# Patient Record
Sex: Male | Born: 1975 | Race: White | Hispanic: No | Smoking: Current every day smoker
Health system: Southern US, Community
[De-identification: ages and names within clinical notes are randomized; demographics above are authoritative.]

## PROBLEM LIST (undated history)

## (undated) DIAGNOSIS — K219 Gastro-esophageal reflux disease without esophagitis: Secondary | ICD-10-CM

## (undated) DIAGNOSIS — Z8669 Personal history of other diseases of the nervous system and sense organs: Secondary | ICD-10-CM

## (undated) DIAGNOSIS — Z8679 Personal history of other diseases of the circulatory system: Secondary | ICD-10-CM

## (undated) DIAGNOSIS — F419 Anxiety disorder, unspecified: Secondary | ICD-10-CM

## (undated) HISTORY — DX: Gastro-esophageal reflux disease without esophagitis: K21.9

## (undated) HISTORY — DX: Personal history of other diseases of the circulatory system: Z86.79

## (undated) HISTORY — PX: BACK SURGERY: SHX140

## (undated) HISTORY — DX: Personal history of other diseases of the nervous system and sense organs: Z86.69

---

## 2011-08-11 ENCOUNTER — Encounter (HOSPITAL_COMMUNITY): Payer: Self-pay

## 2011-08-11 ENCOUNTER — Emergency Department (INDEPENDENT_AMBULATORY_CARE_PROVIDER_SITE_OTHER)
Admission: EM | Admit: 2011-08-11 | Discharge: 2011-08-11 | Disposition: A | Discharge status: Home / Self Care | Payer: BC Managed Care – PPO | Source: Home / Self Care | Attending: Family Medicine | Admitting: Family Medicine

## 2011-08-11 DIAGNOSIS — L989 Disorder of the skin and subcutaneous tissue, unspecified: Secondary | ICD-10-CM

## 2011-08-11 DIAGNOSIS — N509 Disorder of male genital organs, unspecified: Secondary | ICD-10-CM

## 2011-08-11 NOTE — ED Notes (Signed)
Has had several close family members diagnosed and treated for various forms of cancer and melanoma in past few months, and is concerned about several lesions in skin (scrotal, scalp. Lip, arm) area are mildly uncomfortable (scrotal lesion is "angry " looking)

## 2011-08-11 NOTE — ED Provider Notes (Signed)
History     CSN: [id number]  Arrival date & time 08/11/11  1551   First MD Initiated Contact with Patient 08/11/11 1701     5:00 PM HPI Pt reports he is here for multiple skin lesion on his body. Most significant locations are his right medial thigh, left medial thigh, left forehead, and scotum. States lesions are round, raised, firm, painless, nonpruritic, and nonerythematous. Denies drainage. Reports regular STD check but has not been checked in th last year. Denies penile discharge or pain. Denies recent weight loss or fatigue. Reports calling local dermatologist but unable to make an appointment until June, so he came to the [facility]. Patient is a 36 y.o. male presenting with rash. The history is provided by the patient.  Rash  This is a new problem. Episode onset: at least 2 weeks. The problem has not changed since onset.The problem is associated with an unknown factor. There has been no fever. The rash is present on the face, genitalia, lips, left upper leg and right upper leg. The pain is at a severity of 0/10. The patient is experiencing no pain. Pertinent negatives include no blisters, no itching, no pain and no weeping. He has tried nothing for the symptoms.    History reviewed. No pertinent past medical history.  History reviewed. No pertinent past surgical history.  History reviewed. No pertinent family history.  History  Substance Use Topics  . Smoking status: Former Smoker -- 20 years    Types: Cigarettes  . Smokeless tobacco: Not on file  . Alcohol Use: Yes      Review of Systems  Constitutional: Negative for fever, chills and unexpected weight change.  Skin: Positive for rash. Negative for itching and wound.  Neurological: Negative for dizziness and headaches.  All other systems reviewed and are negative.    Allergies  Review of patient's allergies indicates no known allergies.  Home Medications  No current outpatient prescriptions on file.  BP 127/63  Pulse  75  Temp(Src) 98.4 F (36.9 C) (Oral)  Resp 18  SpO2 96%  Physical Exam  Constitutional: He is oriented to person, place, and time. He appears well-developed and well-nourished.  HENT:  Head: Normocephalic and atraumatic.  Eyes: Pupils are equal, round, and reactive to light.  Neurological: He is alert and oriented to person, place, and time.  Skin: Skin is warm and dry. No erythema. No pallor.       Patient has small <0.5cm maculopapular lesions on right thigh, left thigh, and left forehead. Lesions are round and firm. Slight darker then surrounding skin. Scotum lesion is approximately 1cm in diameter, nontender, nondraining, and no erythema.   Psychiatric: He has a normal mood and affect. His behavior is normal.    ED Course  Procedures   MDM   Tested for syphilis and gave referral for dermatology. Pt agrees with plan and is ready for d/c     Thomasene Lot, PA-C 08/11/11 1802

## 2011-08-11 NOTE — Discharge Instructions (Signed)
Today we took a syphilis test. This test should take 2 days to return if positive you will receive a call. Please follow-up with the dermatologist for further evaluation

## 2011-08-11 NOTE — ED Provider Notes (Signed)
Medical screening examination/treatment/procedure(s) were performed by non-physician practitioner and as supervising physician I was immediately available for consultation/collaboration.   [facility]; MD   Sharin Grave, MD 08/11/11 2100

## 2011-08-12 LAB — RPR: RPR Ser Ql: NONREACTIVE

## 2012-11-04 ENCOUNTER — Ambulatory Visit: Payer: BC Managed Care – PPO | Admitting: Family Medicine

## 2012-11-04 VITALS — BP 112/78 | HR 62 | Temp 98.1°F | Resp 16 | Ht 70.0 in | Wt 191.0 lb

## 2012-11-04 DIAGNOSIS — B079 Viral wart, unspecified: Secondary | ICD-10-CM

## 2012-11-04 DIAGNOSIS — R079 Chest pain, unspecified: Secondary | ICD-10-CM

## 2012-11-04 DIAGNOSIS — F411 Generalized anxiety disorder: Secondary | ICD-10-CM

## 2012-11-04 DIAGNOSIS — A6000 Herpesviral infection of urogenital system, unspecified: Secondary | ICD-10-CM

## 2012-11-04 DIAGNOSIS — R229 Localized swelling, mass and lump, unspecified: Secondary | ICD-10-CM

## 2012-11-04 MED ORDER — ALPRAZOLAM 0.5 MG PO TABS: ORAL_TABLET | ORAL | Status: DC

## 2012-11-04 MED ORDER — BUPROPION HCL ER (SR) 150 MG PO TB12: 150.0000 mg | ORAL_TABLET | Freq: Two times a day (BID) | ORAL | Status: DC

## 2012-11-04 MED ORDER — VALACYCLOVIR HCL 500 MG PO TABS: 500.0000 mg | ORAL_TABLET | Freq: Two times a day (BID) | ORAL | Status: DC

## 2012-11-04 NOTE — Patient Instructions (Addendum)
Let me know how you are doing with the medication- please give me an email in 2 or 3 weeks.  Be careful with the xanax as it can be habit forming and can caused sedation  Let me know if you would like to see a cardiologist for your chest pain.    Use the valtrex as needed for outbreaks.

## 2012-11-04 NOTE — Progress Notes (Signed)
[facility] [address] [place] [address] [phone]  Date:  11/04/2012   Name:  Zachary Osborn   DOB:  Jan 10, 1976   MRN:  [id number]  PCP:  No PCP Per Patient    Chief Complaint: Anxiety and Exposure to STD   History of Present Illness:  Zachary Osborn is a 37 y.o. 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  He has used some valtrex from his finance which has always "cleared it up right away."  He first had an outbreak in February of this year.  2 outbreaks so far, neither have been severe.  He declines testing today as he feels sure of the cause.    He notes digestive symptoms (alternating constipation and diarrhea, worse after dairy products), headaches, anxiety, trouble sleeping (for almost all his life). He has noted increased anxiety for about 3 months.   He has also noted some chest pains.  He has noted this for a few years, usually notes it when he is more stressed.   In the 90's he had some cardiac evaluation and was diagnosed with ?irregular heartbeat.  No sign of CAD or structural problems per his knowledge.  The CP seems to occur 3 or 4 days a week. It is worse if he is very anxious and lies down.  The pain is non- exertional.  The pain may last 10 minutes to hours.  He thinks this is probably anxiety related.    He does tend to struggle with depression and stress.  He travels a lot and has to stay in hotels by himself, which is hard for him.  He is also in a relationship as above, and they have an 78 month old daughter (who is not his biologically but they are raising her together).    No SI or HI.    He will occasionally note some SOB along with the CP, but this is intermittent He has no known history of CAD. However, there is CAD in his family.  His PGF had heart attacks, heart failure and died at 51.  MGM had an MI.    He is a smoker- on and off.  He is now usually using an  e cigarette.    Anxiety has been a problem on and off for many years.  He has been treated with klonopin in the past, but this seemed to be too strong.  He then was on xanax, which did seem to help.  He has not been on any medications recently.  He has not used any antidepressants and is somewhat worried about using these.  However, he is trying to quit smoking so he would consider wellbutrin  He also noted dark spots on his skin.  Both of his parents have a histoyr of skin cancer, but he does not as far as he knows.  He has noted a few nodular lesions on his thighs, and one on his right calf which seems to be getting darker,    He has a wart on the dorsum of his left index finger.  He has noted this for 1 or 2 years, and has tried to treat it at home.   There are no active problems to display for this patient.   No past medical history on file.  No past surgical history on file.  History  Substance Use Topics  . Smoking status: Current Every Day  Smoker -- 20 years    Types: Cigarettes  . Smokeless tobacco: Not on file  . Alcohol Use: Yes    No family history on file.  No Known Allergies  Medication list has been reviewed and updated.  No current outpatient prescriptions on file prior to visit.   No current facility-administered medications on file prior to visit.    Review of Systems:  As per HPI- otherwise negative.   Physical Examination: Filed Vitals:   11/04/12 1238  BP: 112/78  Pulse: 62  Temp: 98.1 F (36.7 C)  Resp: 16   Filed Vitals:   11/04/12 1238  Height: 5\' 10"  (1.778 m)  Weight: 191 lb (86.637 kg)   Body mass index is 27.41 kg/(m^2). Ideal Body Weight: Weight in (lb) to have BMI = 25: 173.9  GEN: WDWN, NAD, Non-toxic, A & O x 3, looks well HEENT: Atraumatic, Normocephalic. Neck supple. No masses, No LAD.  Bilateral TM wnl, oropharynx normal.  PEERL,EOMI.   Ears and Nose: No external deformity. CV: RRR, No M/G/R. No JVD. No thrill. No extra  heart sounds. PULM: CTA B, no wheezes, crackles, rhonchi. No retractions. No resp. distress. No accessory muscle use. ABD: S, NT, ND, +BS. No rebound. No HSM. EXTR: No c/c/e NEURO Normal gait.  PSYCH: Normally interactive. Conversant. Not depressed or anxious appearing.  Calm demeanor.  There is a 5mm wart on the dorsum of the left index finger He has two small nodules, one on each thigh. Palpable, less than 5mm in diameter. There is another similar lesion on the right inner calf.    VC obtained.  LN X3 to wart as above  EKG: normal sinus rhythm  Assessment and Plan: Chest pain - Plan: EKG 12-Lead  Anxiety state, unspecified - Plan: buPROPion (WELLBUTRIN SR) 150 MG 12 hr tablet, ALPRAZolam (XANAX) 0.5 MG tablet  Wart viral  Genital herpes - Plan: valACYclovir (VALTREX) 500 MG tablet  Skin nodule - Plan: Ambulatory referral to Dermatology   Urias is here with several concerns today.   Declines testing for HSV.  Gave him valtrex to use prn for outbreaks.   Depression and anxiety: he has suffered from this for some time. Wellbutrin, and xanax as needed for sleep.  Cautioned regarding dependence with xanax. He will email me in 2 or 3 weeks with an update LN to wart as above  CP: likely related to his anxiety.  However, let him know I cannot rule- out a cardiac problem, and encouraged a cardiology referral.  However, he declines this for now.   Referral to derm for skin nodules that are concerning him.     Signed Abbe Amsterdam, MD

## 2012-12-14 ENCOUNTER — Ambulatory Visit: Payer: BC Managed Care – PPO | Admitting: Family Medicine

## 2012-12-14 VITALS — BP 114/64 | HR 61 | Temp 98.4°F | Resp 16 | Ht 71.5 in | Wt 184.8 lb

## 2012-12-14 DIAGNOSIS — H01001 Unspecified blepharitis right upper eyelid: Secondary | ICD-10-CM

## 2012-12-14 DIAGNOSIS — B078 Other viral warts: Secondary | ICD-10-CM

## 2012-12-14 DIAGNOSIS — F411 Generalized anxiety disorder: Secondary | ICD-10-CM

## 2012-12-14 DIAGNOSIS — H01009 Unspecified blepharitis unspecified eye, unspecified eyelid: Secondary | ICD-10-CM

## 2012-12-14 MED ORDER — DOXYCYCLINE HYCLATE 100 MG PO TABS: 100.0000 mg | ORAL_TABLET | Freq: Two times a day (BID) | ORAL | Status: DC

## 2012-12-14 MED ORDER — ALPRAZOLAM 1 MG PO TABS: 0.5000 mg | ORAL_TABLET | Freq: Three times a day (TID) | ORAL | Status: DC | PRN

## 2012-12-14 MED ORDER — ERYTHROMYCIN 5 MG/GM OP OINT: TOPICAL_OINTMENT | Freq: Four times a day (QID) | OPHTHALMIC | Status: DC

## 2012-12-14 NOTE — Patient Instructions (Addendum)
Start the ointment and oral antibiotic,  Warm compresses, other info below. If not completely resolved after antibiotics, or return of symptoms after stopping antibiotic - a longer course may be needed - recheck if this occurs. Return to the clinic or go to the nearest emergency room if any of your symptoms worsen or new symptoms occur. Blepharitis Blepharitis is redness, soreness, and swelling (inflammation) of one or both eyelids. It may be caused by an allergic reaction or a bacterial infection. Blepharitis may also be associated with reddened, scaly skin (seborrhea) of the scalp and eyebrows. While you sleep, eye discharge may cause your eyelashes to stick together. Your eyelids may itch, burn, swell, and may lose their lashes. These will grow back. Your eyes may become sensitive. Blepharitis may recur and need repeated treatment. If this is the case, you may require further evaluation by an eye specialist (ophthalmologist). HOME CARE INSTRUCTIONS   Keep your hands clean.  Use a clean towel each time you dry your eyelids. Do not use this towel to clean other areas. Do not share a towel or makeup with anyone.  Wash your eyelids with warm water or warm water mixed with a small amount of baby shampoo. Do this twice a day or as often as needed.  Wash your face and eyebrows at least once a day.  Use warm compresses 2 times a day for 10 minutes at a time, or as directed by your caregiver.  Apply antibiotic ointment as directed by your caregiver.  Avoid rubbing your eyes.  Avoid wearing makeup until you get better.  Follow up with your caregiver as directed. SEEK IMMEDIATE MEDICAL CARE IF:   You have pain, redness, or swelling that gets worse or spreads to other parts of your face.  Your vision changes, or you have pain when looking at lights or moving objects.  You have a fever.  Your symptoms continue for longer than 2 to 4 days or become worse. MAKE SURE YOU:   Understand these  instructions.  Will watch your condition.  Will get help right away if you are not doing well or get worse. Document Released: 04/14/2000 Document Revised: 07/10/2011 Document Reviewed: 05/25/2010 [facility] Patient Information 2014 [place], [place].  Can increase to 1mg  xanax tablet at night if needed, but can split in half during the day as needed. Continue exercise and other stress management techniques, and if you would like to meet with a psychologist, let me know and I can provide some names or numbers if needed. Follow up with myself or Dr. Patsy Lager in the next 6 weeks to discuss your medications. Sooner if worse, or call with questions.   Bandage over wart area if blisters.  If not improved with today's treatment - may need to look at incision or paring this area to remove. Return to the clinic or go to the nearest emergency room if any of your symptoms worsen or new symptoms occur.

## 2012-12-14 NOTE — Progress Notes (Signed)
Subjective:    Patient ID: Zachary Osborn, male    DOB: [birth year], 37 y.o.   MRN: [id number]  HPI Zachary Osborn is a 37 y.o. male  R eyelid infection - 1st noticed yesterday am - irritated upper lid yesterday am - swollen last night, then more swollen this am with yellow discharge on inside of eye. No fever, L eye ok. Sore on R side, not itchy.no new soap or derm products. Same face wash. Vision ok. Otherwise feels well.  Saline wash.    Additional concerns today:   Discuss meds form last ov with Dr. Patsy Lager and refreezing a wart.   Started on Wellbutrin SR, 150mg  and xanax 0.5mg  QHS prn at 11/04/12 ov with Dr. Patsy Lager, per that ov - plan of emailing with update in sx's in few weeks. Hx of depression/anxiety sx's in past - stress at job, buying a house and getting married. Wellbutrin helps with depression, no SI, but still feels anxious - especially at night, occasionally during the day. Taking xanax about once per week during the day, and 2 times per week at night - has to take 2 at night usually. Has about 12 left. 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 Situational anxiety in past. Has been exercising - feels like this helps, but time is an issue.   Wart on L index finger - treated with liquid No2 x3 cycles at 11/04/12 ov., no blistering after treatment here, tried to freeze on own with liquid nitrogen at his work about 3 weeks ago - slight blistering, with some areas off, but enlarged now.    SH: works on [employer].  Review of Systems  Constitutional: Negative for fever and chills.  HENT: Negative for congestion and rhinorrhea.   Eyes: Positive for pain, discharge and redness. Negative for photophobia, itching and visual disturbance.       Objective:   Physical Exam  Vitals reviewed. Constitutional: He is oriented to person, place, and time. He appears well-developed and well-nourished.  HENT:  Head: Normocephalic and atraumatic.    Right Ear: Tympanic membrane, external ear and ear canal normal.  Left Ear: Tympanic membrane, external ear and ear canal normal.  Nose: No rhinorrhea.  Mouth/Throat: Oropharynx is clear and moist and mucous membranes are normal. No oropharyngeal exudate or posterior oropharyngeal erythema.  Eyes: Conjunctivae are normal. Pupils are equal, round, and reactive to light.  Neck: Neck supple.  Cardiovascular: Normal rate, regular rhythm, normal heart sounds and intact distal pulses.   No murmur heard. Pulmonary/Chest: Effort normal and breath sounds normal. He has no wheezes. He has no rhonchi. He has no rales.  Abdominal: Soft. There is no tenderness.  Musculoskeletal:       Hands: Lymphadenopathy:    He has no cervical adenopathy.  Neurological: He is alert and oriented to person, place, and time.  Skin: Skin is warm and dry. No rash noted.  Psychiatric: He has a normal mood and affect. His behavior is normal. Judgment and thought content normal.   Risks (including but not limited to skin color cahnge/hypopigmentation and infection), benefits, and alternatives discussed for cryodestruction of finger wart.  Verbal consent obtained after any questions were answered. Liquid nitrogen focused on wart - 1 initial freezing through ear speculum - insufficient ice ball.  3 freeze/thaw cycles with directed spray.  No complications.RTC precautions discussed.     Assessment & Plan:  Zachary Osborn is a 37 y.o. male Blepharitis of right upper eyelid -  Plan: doxycycline (VIBRA-TABS) 100 MG tablet, erythromycin (ROMYCIN) ophthalmic ointment.  Start ointment Q6h - if unable to tolerate this during day, at least apply at bedtime. Start doxycycline as quick onset, warm compresses and sx care as below, but rtc precautions discussed.   Anxiety state, unspecified - Plan: ALPRAZolam (XANAX) 1 MG tablet.  Situational anxiety, with possible prior depression/anxiety prior vs situational/adjustmen disorder. Episodic  use only of xanax, but for dosing reasons - will change to 1mg  tablet - can split in half during day if needed, ok to cont 1mg  dose at night. Discussed stress mgt and coping techniques, continue exercise.  Declined counselor eval/referral today, but recommended if sx's persist. Continue Wellbutrin at same dose for now. recheck with myself or Dr. Patsy Lager in next 6 weeks.   Common wart - recurrent. cryodestruction as above. rtc precautions - due to recurrence - may need paring and retreatment.   Meds ordered this encounter  Medications  . doxycycline (VIBRA-TABS) 100 MG tablet    Sig: Take 1 tablet (100 mg total) by mouth 2 (two) times daily.    Dispense:  20 tablet    Refill:  0  . erythromycin (ROMYCIN) ophthalmic ointment    Sig: Place into the right eye every 6 (six) hours. For next 7-10 days.    Dispense:  3.5 g    Refill:  1  . ALPRAZolam (XANAX) 1 MG tablet    Sig: Take 0.5-1 tablets (0.5-1 mg total) by mouth 3 (three) times daily as needed for sleep or anxiety.    Dispense:  30 tablet    Refill:  0   Patient Instructions  Start the ointment and oral antibiotic,  Warm compresses, other info below. If not completely resolved after antibiotics, or return of symptoms after stopping antibiotic - a longer course may be needed - recheck if this occurs. Return to the clinic or go to the nearest emergency room if any of your symptoms worsen or new symptoms occur. Blepharitis Blepharitis is redness, soreness, and swelling (inflammation) of one or both eyelids. It may be caused by an allergic reaction or a bacterial infection. Blepharitis may also be associated with reddened, scaly skin (seborrhea) of the scalp and eyebrows. While you sleep, eye discharge may cause your eyelashes to stick together. Your eyelids may itch, burn, swell, and may lose their lashes. These will grow back. Your eyes may become sensitive. Blepharitis may recur and need repeated treatment. If this is the case, you may require  further evaluation by an eye specialist (ophthalmologist). HOME CARE INSTRUCTIONS   Keep your hands clean.  Use a clean towel each time you dry your eyelids. Do not use this towel to clean other areas. Do not share a towel or makeup with anyone.  Wash your eyelids with warm water or warm water mixed with a small amount of baby shampoo. Do this twice a day or as often as needed.  Wash your face and eyebrows at least once a day.  Use warm compresses 2 times a day for 10 minutes at a time, or as directed by your caregiver.  Apply antibiotic ointment as directed by your caregiver.  Avoid rubbing your eyes.  Avoid wearing makeup until you get better.  Follow up with your caregiver as directed. SEEK IMMEDIATE MEDICAL CARE IF:   You have pain, redness, or swelling that gets worse or spreads to other parts of your face.  Your vision changes, or you have pain when looking at lights or moving objects.  You have a fever.  Your symptoms continue for longer than 2 to 4 days or become worse. MAKE SURE YOU:   Understand these instructions.  Will watch your condition.  Will get help right away if you are not doing well or get worse. Document Released: 04/14/2000 Document Revised: 07/10/2011 Document Reviewed: 05/25/2010 [facility] Patient Information 2014 [place], [place].  Can increase to 1mg  xanax tablet at night if needed, but can split in half during the day as needed. Continue exercise and other stress management techniques, and if you would like to meet with a psychologist, let me know and I can provide some names or numbers if needed. Follow up with myself or Dr. Patsy Lager in the next 6 weeks to discuss your medications. Sooner if worse, or call with questions.   Bandage over wart area if blisters.  If not improved with today's treatment - may need to look at incision or paring this area to remove. Return to the clinic or go to the nearest emergency room if any of your symptoms worsen or  new symptoms occur.

## 2013-01-14 ENCOUNTER — Emergency Department (HOSPITAL_COMMUNITY): Payer: BC Managed Care – PPO

## 2013-01-14 ENCOUNTER — Encounter (HOSPITAL_COMMUNITY): Payer: Self-pay | Admitting: Emergency Medicine

## 2013-01-14 ENCOUNTER — Emergency Department (HOSPITAL_COMMUNITY)
Admission: EM | Admit: 2013-01-14 | Discharge: 2013-01-14 | Disposition: A | Discharge status: Home / Self Care | Payer: BC Managed Care – PPO | Attending: Emergency Medicine | Admitting: Emergency Medicine

## 2013-01-14 DIAGNOSIS — Y929 Unspecified place or not applicable: Secondary | ICD-10-CM | POA: Insufficient documentation

## 2013-01-14 DIAGNOSIS — R109 Unspecified abdominal pain: Secondary | ICD-10-CM | POA: Insufficient documentation

## 2013-01-14 DIAGNOSIS — M545 Low back pain, unspecified: Secondary | ICD-10-CM | POA: Insufficient documentation

## 2013-01-14 DIAGNOSIS — M549 Dorsalgia, unspecified: Secondary | ICD-10-CM

## 2013-01-14 DIAGNOSIS — X500XXA Overexertion from strenuous movement or load, initial encounter: Secondary | ICD-10-CM | POA: Insufficient documentation

## 2013-01-14 DIAGNOSIS — F411 Generalized anxiety disorder: Secondary | ICD-10-CM | POA: Insufficient documentation

## 2013-01-14 DIAGNOSIS — Y9301 Activity, walking, marching and hiking: Secondary | ICD-10-CM | POA: Insufficient documentation

## 2013-01-14 DIAGNOSIS — F17200 Nicotine dependence, unspecified, uncomplicated: Secondary | ICD-10-CM | POA: Insufficient documentation

## 2013-01-14 DIAGNOSIS — M62838 Other muscle spasm: Secondary | ICD-10-CM | POA: Insufficient documentation

## 2013-01-14 DIAGNOSIS — Z79899 Other long term (current) drug therapy: Secondary | ICD-10-CM | POA: Insufficient documentation

## 2013-01-14 HISTORY — DX: Anxiety disorder, unspecified: F41.9

## 2013-01-14 MED ORDER — DIAZEPAM 5 MG PO TABS: 5.0000 mg | ORAL_TABLET | Freq: Once | ORAL | Status: DC

## 2013-01-14 MED ORDER — DIAZEPAM 5 MG PO TABS
5.0000 mg | ORAL_TABLET | Freq: Once | ORAL | Status: AC
  Administered 2013-01-14: 5 mg via ORAL
  Filled 2013-01-14: qty 1

## 2013-01-14 MED ORDER — OXYCODONE-ACETAMINOPHEN 5-325 MG PO TABS
2.0000 | ORAL_TABLET | Freq: Once | ORAL | Status: AC
  Administered 2013-01-14: 2 via ORAL
  Filled 2013-01-14: qty 2

## 2013-01-14 MED ORDER — OXYCODONE-ACETAMINOPHEN 5-325 MG PO TABS: 1.0000 | ORAL_TABLET | Freq: Four times a day (QID) | ORAL | Status: DC | PRN

## 2013-01-14 NOTE — ED Provider Notes (Signed)
CSN: [id number]     Arrival date & time 01/14/13  0227 History   First MD Initiated Contact with Patient 01/14/13 0522     Chief Complaint  Patient presents with  . Fall  . Back Pain   (Consider location/radiation/quality/duration/timing/severity/associated sxs/prior Treatment) HPI Comments: Slipped walking down the stairs, did not fall, caught himself but twisted his back.  Patient is a 37 y.o. male presenting with fall and back pain. The history is provided by the patient.  Fall This is a new problem. The current episode started 6 to 12 hours ago. Episode frequency: once. The problem has been resolved. Pertinent negatives include no abdominal pain and no shortness of breath. Nothing aggravates the symptoms. Nothing relieves the symptoms.  Back Pain Associated symptoms: no abdominal pain and no fever     Past Medical History  Diagnosis Date  . Anxiety    History reviewed. No pertinent past surgical history. No family history on file. History  Substance Use Topics  . Smoking status: Current Every Day Smoker -- 20 years    Types: Cigarettes  . Smokeless tobacco: Not on file  . Alcohol Use: Yes    Review of Systems  Constitutional: Negative for fever.  Respiratory: Negative for cough and shortness of breath.   Gastrointestinal: Negative for vomiting and abdominal pain.  Musculoskeletal: Positive for back pain.  All other systems reviewed and are negative.    Allergies  Review of patient's allergies indicates no known allergies.  Home Medications   Current Outpatient Rx  Name  Route  Sig  Dispense  Refill  . ALPRAZolam (XANAX) 1 MG tablet   Oral   Take 0.5-1 tablets (0.5-1 mg total) by mouth 3 (three) times daily as needed for sleep or anxiety.   30 tablet   0   . buPROPion (WELLBUTRIN SR) 150 MG 12 hr tablet   Oral   Take 1 tablet (150 mg total) by mouth 2 (two) times daily. Take one daily for the first 3 days   60 tablet   5   . diazepam (VALIUM) 5 MG  tablet   Oral   Take 1 tablet (5 mg total) by mouth once.   10 tablet   0   . oxyCODONE-acetaminophen (PERCOCET/ROXICET) 5-325 MG per tablet   Oral   Take 1 tablet by mouth every 6 (six) hours as needed for pain.   15 tablet   0    BP 135/78  Pulse 88  Temp(Src) 97.7 F (36.5 C) (Oral)  Resp 16  SpO2 99% Physical Exam  Nursing note and vitals reviewed. Constitutional: He is oriented to person, place, and time. He appears well-developed and well-nourished. No distress.  HENT:  Head: Normocephalic and atraumatic.  Mouth/Throat: No oropharyngeal exudate.  Eyes: EOM are normal. Pupils are equal, round, and reactive to light.  Neck: Normal range of motion. Neck supple.  Cardiovascular: Normal rate and regular rhythm.  Exam reveals no friction rub.   No murmur heard. Pulmonary/Chest: Effort normal and breath sounds normal. No respiratory distress. He has no wheezes. He has no rales.  Abdominal: He exhibits no distension. There is no tenderness. There is no rebound.  Musculoskeletal: Normal range of motion. He exhibits no edema.       Thoracic back: He exhibits no bony tenderness.       Lumbar back: He exhibits tenderness (L flank, lumbar). He exhibits no bony tenderness.  Neurological: He is alert and oriented to person, place, and time. No cranial  nerve deficit. He exhibits normal muscle tone. Coordination normal.  Skin: No rash noted. He is not diaphoretic.    ED Course  Procedures (including critical care time) Labs Review Labs Reviewed - No data to display Imaging Review Dg Thoracic Spine 2 View  01/14/2013   CLINICAL DATA:  Mid to lower back pain for 1 day  EXAM: THORACIC SPINE - 2 VIEW  COMPARISON:  None.  FINDINGS: No evident fracture or subluxation. Mild lower thoracic disc narrowing and endplate spurring, most notable at T9-10 and T10-11.  IMPRESSION: 1. Negative for acute osseous abnormality. 2. Mild degenerative disc changes.   Electronically Signed   By: Tiburcio Pea   On: 01/14/2013 03:55    MDM   1. Back pain   2. Muscle spasm    32M with hx of sciatic pain presents with back pain. Hx of prior sciatica - worse today after twisting and carrying heavy objects. No fall or trauma. Patient did slip down the stairs, but caught himself and twisted his back. No numbness, weakness, bladder/bowel incontinence, urinary retention, fever, saddle anesthesia. No concern for cauda equina. On exam, VSS, L flank, posterior back pain. No midline pain. Normal strength in LE. Patient ambulating well. Percocet and Valium given. Patient persistent he get an MRI, no emergent criteria met, instructed to f/u with PCP and get referred for MRI. Stable for discharge.     Dagmar Hait, MD 01/14/13 [phone]

## 2013-01-14 NOTE — ED Notes (Signed)
Pt. fell at home yesterday , reports pain at mid back worse with movement and certain positions.

## 2013-01-14 NOTE — ED Notes (Signed)
Pt states that his initial pain was started at 15:00 when he lift something and twisted. Pt states that he did not feel pain in his back until 19:00 when he was taking about heavy object out the truck of the car and then his mid back started hurting. Pt states that he feels the pain in his spine, but denies tingling, numbness, pt ambulatory, cap refill less than 3, CNS intact and pulses present.

## 2013-02-13 ENCOUNTER — Other Ambulatory Visit: Payer: Self-pay | Admitting: Family Medicine

## 2013-02-13 DIAGNOSIS — F418 Other specified anxiety disorders: Secondary | ICD-10-CM

## 2013-02-13 NOTE — Telephone Encounter (Signed)
Dr Neva Seat, do you want to RF this? You wanted pt to f/up in 6 weeks so he is due for f/up.

## 2013-02-17 NOTE — Telephone Encounter (Signed)
Thanks faxed with note for pharmacy to advise patient due.

## 2013-02-17 NOTE — Telephone Encounter (Signed)
i can refill once, but needs ov with Dr. Patsy Lager or myself prior to this one running out. Thanks. Will be ready for pickup tonight.

## 2013-03-06 ENCOUNTER — Other Ambulatory Visit: Payer: Self-pay

## 2013-04-11 ENCOUNTER — Other Ambulatory Visit: Payer: Self-pay | Admitting: Family Medicine

## 2013-04-11 DIAGNOSIS — F419 Anxiety disorder, unspecified: Secondary | ICD-10-CM

## 2013-04-14 NOTE — Telephone Encounter (Signed)
i refilled this time, but discussed 6 week recheck with myself or Dr. Patsy Lager at August ov. Needs ov with one of [place] prior to any refills beyond this one. Thanks.

## 2013-04-15 ENCOUNTER — Other Ambulatory Visit: Payer: Self-pay | Admitting: Radiology

## 2013-09-04 ENCOUNTER — Ambulatory Visit: Payer: BC Managed Care – PPO | Admitting: Family Medicine

## 2013-09-04 VITALS — BP 104/60 | HR 84 | Temp 98.0°F | Resp 16 | Ht 72.0 in | Wt 180.8 lb

## 2013-09-04 DIAGNOSIS — F341 Dysthymic disorder: Secondary | ICD-10-CM

## 2013-09-04 DIAGNOSIS — F419 Anxiety disorder, unspecified: Secondary | ICD-10-CM

## 2013-09-04 DIAGNOSIS — M62838 Other muscle spasm: Secondary | ICD-10-CM

## 2013-09-04 DIAGNOSIS — M542 Cervicalgia: Secondary | ICD-10-CM

## 2013-09-04 DIAGNOSIS — G894 Chronic pain syndrome: Secondary | ICD-10-CM

## 2013-09-04 DIAGNOSIS — F418 Other specified anxiety disorders: Secondary | ICD-10-CM

## 2013-09-04 DIAGNOSIS — G4700 Insomnia, unspecified: Secondary | ICD-10-CM

## 2013-09-04 DIAGNOSIS — F411 Generalized anxiety disorder: Secondary | ICD-10-CM

## 2013-09-04 MED ORDER — MELOXICAM 7.5 MG PO TABS: 7.5000 mg | ORAL_TABLET | Freq: Every day | ORAL | Status: DC | PRN

## 2013-09-04 MED ORDER — CYCLOBENZAPRINE HCL 5 MG PO TABS: ORAL_TABLET | ORAL | Status: DC

## 2013-09-04 MED ORDER — ALPRAZOLAM 1 MG PO TABS: ORAL_TABLET | ORAL | Status: DC

## 2013-09-04 MED ORDER — BUPROPION HCL ER (XL) 150 MG PO TB24: 150.0000 mg | ORAL_TABLET | Freq: Every day | ORAL | Status: DC
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
## 2013-09-04 NOTE — Patient Instructions (Signed)
I changed the Wellbutrin to the 24 hour version as you are taking this once per day.  If cost goes up, may need to look at other options. Would recommend looking at other medicines like Cymbalta or Effexor that may help anxiety and chronic pain symptoms better. mobic once per day as needed for pain (do not combine with NSIADS like advil or alleve), flexeril at night if needed for muscle spasm/neck pain OR xanax if anxiety is limiting sleep. Don't combine these medicines. REcheck in next 3 months to discuss these medicines and chronic pain further. Return to the clinic or go to the nearest emergency room if any of your symptoms worsen or new symptoms occur. See the neck care manual to see if some helpful information.

## 2013-12-01 ENCOUNTER — Ambulatory Visit: Payer: BC Managed Care – PPO | Admitting: Family Medicine

## 2014-01-29 ENCOUNTER — Ambulatory Visit (INDEPENDENT_AMBULATORY_CARE_PROVIDER_SITE_OTHER): Payer: BC Managed Care – PPO | Admitting: Emergency Medicine

## 2014-01-29 VITALS — BP 128/68 | HR 68 | Temp 98.1°F | Resp 20 | Ht 72.0 in | Wt 178.2 lb

## 2014-01-29 DIAGNOSIS — M5431 Sciatica, right side: Secondary | ICD-10-CM

## 2014-01-29 DIAGNOSIS — F419 Anxiety disorder, unspecified: Secondary | ICD-10-CM

## 2014-01-29 MED ORDER — ALPRAZOLAM 1 MG PO TABS: ORAL_TABLET | ORAL | Status: DC

## 2014-01-29 MED ORDER — VALACYCLOVIR HCL 1 G PO TABS: 1000.0000 mg | ORAL_TABLET | Freq: Three times a day (TID) | ORAL | Status: DC

## 2014-01-29 MED ORDER — TRAMADOL HCL 50 MG PO TABS: 50.0000 mg | ORAL_TABLET | Freq: Four times a day (QID) | ORAL | Status: DC | PRN

## 2014-01-29 MED ORDER — HYDROCODONE-ACETAMINOPHEN 5-325 MG PO TABS: 1.0000 | ORAL_TABLET | ORAL | Status: DC | PRN

## 2014-01-29 NOTE — Patient Instructions (Signed)
Sciatica Sciatica is pain, weakness, numbness, or tingling along the path of the sciatic nerve. The nerve starts in the lower back and runs down the back of each leg. The nerve controls the muscles in the lower leg and in the back of the knee, while also providing sensation to the back of the thigh, lower leg, and the sole of your foot. Sciatica is a symptom of another medical condition. For instance, nerve damage or certain conditions, such as a herniated disk or bone spur on the spine, pinch or put pressure on the sciatic nerve. This causes the pain, weakness, or other sensations normally associated with sciatica. Generally, sciatica only affects one side of the body. CAUSES   Herniated or slipped disc.  Degenerative disk disease.  A pain disorder involving the narrow muscle in the buttocks (piriformis syndrome).  Pelvic injury or fracture.  Pregnancy.  Tumor (rare). SYMPTOMS  Symptoms can vary from mild to very severe. The symptoms usually travel from the low back to the buttocks and down the back of the leg. Symptoms can include:  Mild tingling or dull aches in the lower back, leg, or hip.  Numbness in the back of the calf or sole of the foot.  Burning sensations in the lower back, leg, or hip.  Sharp pains in the lower back, leg, or hip.  Leg weakness.  Severe back pain inhibiting movement. These symptoms may get worse with coughing, sneezing, laughing, or prolonged sitting or standing. Also, being overweight may worsen symptoms. DIAGNOSIS  Your caregiver will perform a physical exam to look for common symptoms of sciatica. He or she may ask you to do certain movements or activities that would trigger sciatic nerve pain. Other tests may be performed to find the cause of the sciatica. These may include:  Blood tests.  X-rays.  Imaging tests, such as an MRI or CT scan. TREATMENT  Treatment is directed at the cause of the sciatic pain. Sometimes, treatment is not necessary  and the pain and discomfort goes away on its own. If treatment is needed, your caregiver may suggest:  Over-the-counter medicines to relieve pain.  Prescription medicines, such as anti-inflammatory medicine, muscle relaxants, or narcotics.  Applying heat or ice to the painful area.  Steroid injections to lessen pain, irritation, and inflammation around the nerve.  Reducing activity during periods of pain.  Exercising and stretching to strengthen your abdomen and improve flexibility of your spine. Your caregiver may suggest losing weight if the extra weight makes the back pain worse.  Physical therapy.  Surgery to eliminate what is pressing or pinching the nerve, such as a bone spur or part of a herniated disk. HOME CARE INSTRUCTIONS   Only take over-the-counter or prescription medicines for pain or discomfort as directed by your caregiver.  Apply ice to the affected area for 20 minutes, 3-4 times a day for the first 48-72 hours. Then try heat in the same way.  Exercise, stretch, or perform your usual activities if these do not aggravate your pain.  Attend physical therapy sessions as directed by your caregiver.  Keep all follow-up appointments as directed by your caregiver.  Do not wear high heels or shoes that do not provide proper support.  Check your mattress to see if it is too soft. A firm mattress may lessen your pain and discomfort. SEEK IMMEDIATE MEDICAL CARE IF:   You lose control of your bowel or bladder (incontinence).  You have increasing weakness in the lower back, pelvis, buttocks,   or legs.  You have redness or swelling of your back.  You have a burning sensation when you urinate.  You have pain that gets worse when you lie down or awakens you at night.  Your pain is worse than you have experienced in the past.  Your pain is lasting longer than 4 weeks.  You are suddenly losing weight without reason. MAKE SURE YOU:  Understand these  instructions.  Will watch your condition.  Will get help right away if you are not doing well or get worse. Document Released: 04/11/2001 Document Revised: 10/17/2011 Document Reviewed: 08/27/2011 Exit[employer] Patient Information 2015 [employer]. This information is not intended to replace advice given to you by your health care provider. Make sure you discuss any questions you have with your health care provider.  

## 2014-01-29 NOTE — Addendum Note (Signed)
Addended by: Roselee Culver on: 01/29/2014 03:14 PM   Modules accepted: Orders, Medications

## 2014-01-29 NOTE — Progress Notes (Signed)
[facility] [address], [place] [address] [phone]  Date:  01/29/2014   Name:  Rushton Early   DOB:  1975-12-26   MRN:  [id number]  PCP:  No PCP Per Patient    Chief Complaint: Medication Refill, Hip Pain and Back Pain   History of Present Illness:  Chanze Teagle is a 38 y.o. very pleasant male patient who presents with the following:  Chronic (years) history of back pain radiating into right leg.  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   Numbness and weakness in leg at times.  Unable to sleep past three nights due pain. Pain has markedly increased in past 6 months and again over last month. Seen once in ER and told had "bulging discs" on plain xrays. No history of overuse or injury Marked decrease in flexibility in past 6 months in right side. No improvement with over the counter medications or other home remedies.   Denies other complaint or health concern today.   Patient Active Problem List   Diagnosis Date Noted  . Generalized anxiety disorder 11/04/2012    Past Medical History  Diagnosis Date  . Anxiety     No past surgical history on file.  History  Substance Use Topics  . Smoking status: Light Tobacco Smoker -- 20 years    Types: Cigarettes  . Smokeless tobacco: Not on file  . Alcohol Use: No    No family history on file.  No Known Allergies  Medication list has been reviewed and updated.  Current Outpatient Prescriptions on File Prior to Visit  Medication Sig Dispense Refill  . ALPRAZolam (XANAX) 1 MG tablet TAKE 1/2 TO 1 TABLET BY MOUTH THREE TIMES A DAY AS NEEDED FOR ANXIETY AND SLEEP  30 tablet  0  . buPROPion (WELLBUTRIN XL) 150 MG 24 hr tablet Take 1 tablet (150 mg total) by mouth daily.  30 tablet  5  . cyclobenzaprine (FLEXERIL) 5 MG tablet 1 pill by mouth up to every 8 hours as needed. Start with one pill by mouth each bedtime as needed due to sedation  30 tablet  0  . meloxicam (MOBIC) 7.5 MG tablet Take 1  tablet (7.5 mg total) by mouth daily as needed for pain.  30 tablet  2  . oxyCODONE-acetaminophen (PERCOCET/ROXICET) 5-325 MG per tablet Take 1 tablet by mouth every 6 (six) hours as needed for pain.  15 tablet  0   No current facility-administered medications on file prior to visit.    Review of Systems:  As per HPI, otherwise negative.    Physical Examination: Filed Vitals:   01/29/14 1412  BP: 128/68  Pulse: 68  Temp: 98.1 F (36.7 C)  Resp: 20   Filed Vitals:   01/29/14 1412  Height: 6' (1.829 m)  Weight: 178 lb 3.2 oz (80.831 kg)   Body mass index is 24.16 kg/(m^2). Ideal Body Weight: Weight in (lb) to have BMI = 25: 183.9   GEN: WDWN, NAD, Non-toxic, Alert & Oriented x 3 HEENT: Atraumatic, Normocephalic.  Ears and Nose: No external deformity. EXTR: No clubbing/cyanosis/edema NEURO: Normal gait.  PSYCH: Normally interactive. Conversant. Not depressed or anxious appearing.  Calm demeanor.  Back:  No tenderness except in right sciatic notch.   Assessment and Plan: Sciatica Tramadol MRI  Signed,  Ellison Carwin, MD

## 2014-02-03 ENCOUNTER — Other Ambulatory Visit: Payer: Self-pay | Admitting: Emergency Medicine

## 2014-02-03 DIAGNOSIS — M5431 Sciatica, right side: Secondary | ICD-10-CM

## 2014-02-04 ENCOUNTER — Telehealth: Payer: Self-pay

## 2014-02-04 NOTE — Telephone Encounter (Signed)
Patient has MRI scheduled next week.  Crying with pain, requesting something stronger.  Can come in for revisit.    [facility] on [address]   [phone]

## 2014-02-09 ENCOUNTER — Ambulatory Visit
Admission: RE | Admit: 2014-02-09 | Discharge: 2014-02-09 | Disposition: A | Discharge status: Home / Self Care | Payer: BC Managed Care – PPO | Source: Ambulatory Visit | Attending: Emergency Medicine | Admitting: Emergency Medicine

## 2014-02-09 ENCOUNTER — Telehealth: Payer: Self-pay | Admitting: *Deleted

## 2014-02-09 DIAGNOSIS — M5431 Sciatica, right side: Secondary | ICD-10-CM

## 2014-02-09 NOTE — Telephone Encounter (Signed)
Results to report to pt?

## 2014-02-09 NOTE — Telephone Encounter (Signed)
Pt was wanting Dr. Ouida Sills to know that he had his MRI today at [facility].  Pt [phone] with results, please.

## 2014-02-10 ENCOUNTER — Other Ambulatory Visit: Payer: Self-pay | Admitting: Emergency Medicine

## 2014-02-10 DIAGNOSIS — M5430 Sciatica, unspecified side: Secondary | ICD-10-CM

## 2014-02-13 NOTE — Telephone Encounter (Signed)
See imaging note. Pt has been notified.

## 2014-02-13 NOTE — Telephone Encounter (Signed)
He was referred to neurosurgery

## 2014-05-05 ENCOUNTER — Ambulatory Visit (INDEPENDENT_AMBULATORY_CARE_PROVIDER_SITE_OTHER): Payer: BLUE CROSS/BLUE SHIELD | Admitting: Family Medicine

## 2014-05-05 VITALS — BP 98/48 | HR 74 | Temp 98.5°F | Resp 16 | Ht 71.0 in | Wt 175.0 lb

## 2014-05-05 DIAGNOSIS — A6000 Herpesviral infection of urogenital system, unspecified: Secondary | ICD-10-CM

## 2014-05-05 DIAGNOSIS — R110 Nausea: Secondary | ICD-10-CM

## 2014-05-05 DIAGNOSIS — R197 Diarrhea, unspecified: Secondary | ICD-10-CM

## 2014-05-05 DIAGNOSIS — F419 Anxiety disorder, unspecified: Secondary | ICD-10-CM

## 2014-05-05 DIAGNOSIS — M549 Dorsalgia, unspecified: Secondary | ICD-10-CM

## 2014-05-05 LAB — POCT CBC
GRANULOCYTE PERCENT: 54.8 % (ref 37–80)
HEMATOCRIT: 46.4 % (ref 43.5–53.7)
Hemoglobin: 15.4 g/dL (ref 14.1–18.1)
Lymph, poc: 3.5 — AB (ref 0.6–3.4)
MCH: 30.9 pg (ref 27–31.2)
MCHC: 33.1 g/dL (ref 31.8–35.4)
MCV: 93.4 fL (ref 80–97)
MID (cbc): 0.6 (ref 0–0.9)
MPV: 7.3 fL (ref 0–99.8)
POC Granulocyte: 4.9 (ref 2–6.9)
POC LYMPH %: 38.8 % (ref 10–50)
POC MID %: 6.4 %M (ref 0–12)
Platelet Count, POC: 289 10*3/uL (ref 142–424)
RBC: 4.97 M/uL (ref 4.69–6.13)
RDW, POC: 13.2 %
WBC: 8.9 10*3/uL (ref 4.6–10.2)

## 2014-05-05 MED ORDER — ALPRAZOLAM 1 MG PO TABS: ORAL_TABLET | ORAL | Status: DC

## 2014-05-05 MED ORDER — VALACYCLOVIR HCL 1 G PO TABS: 1000.0000 mg | ORAL_TABLET | Freq: Every day | ORAL | Status: DC

## 2014-05-05 MED ORDER — ONDANSETRON 4 MG PO TBDP: 4.0000 mg | ORAL_TABLET | Freq: Three times a day (TID) | ORAL | Status: DC | PRN

## 2014-05-05 NOTE — Progress Notes (Signed)
Subjective:    Patient ID: Zachary Osborn, male    DOB: 10-29-1975, 39 y.o.   MRN: [id number] This chart was scribed for Zachary Ray, MD by Marti Sleigh, Medical Scribe. This patient was seen in Room 10 and the patient's care was started a 1:30 PM.  Chief Complaint  Patient presents with  . Diarrhea    and nausea--x2-3 days  . Anxiety  . Insomnia    has been out of Xanax for 2 months  . Headache    x 2-3 days  . Medication Refill    wants to be on Valtrex prophylactically    HPI HPI Comments: Zachary Osborn is a 39 y.o. male who presents to [facility] today for multiple concerns including HA, diarrhea, xanax refill, and valtrex refill. Pt states that he needs a refill of his valtrex medication due to his genital herpes.   Diarrhea: Pt reports extreme diarrhea, 20 events in the last three days. Pt denies recent travel, abx, diet changes, recent hospitalizations, or vomiting. Pt states his wife has had some mild diarrhea recently. Pt states that he has been taking hydrocodone daily until one week ago, and believes that his diarrhea may be related to withdrawal from hydrocodone. Pt states the last time he took a hydrocodone was one week ago. Pt states he was averaging 1-2 pain pills daily. Pt's dosage was hydrocodone 10s 325mg , prescribed by his neurosurgeon for back pain. Pt states that he has been awake for three days, due to family issues and back pain.  Back Pain: Pt also reports ongoing severe back pain with associated HA. Pt states he was prescribed daily steroids and physical therapy three days per week over the last two years by his neurosurgeon, with gradual improvement of his sx. Pt states he recently reinjured his back at work, and scheduled an appointment with his neurosurgeon tomorrow.   Anxiety: Pt reports continued occasional anxiety due to monetary and family stressors with associated insomnia. Pt would like his xanax prescription refilled. Pt states he is having difficulty  with his wife because she is taking regular amphetamines, and is over anxious and isn't sleeping. He states she is staying up late and doing house work at odd hours, which is contributing to his anxiety.  Depression: Pt states that his depression symptoms have largely resolved. He reports that he experienced some improvement with his initial Wellbutrin prescription in July 2014, but after his medication was changed to a time-released dosage he experienced abnormal SI, which he felt was related to his new medication. Pt reports he discontinued his Wellbutrin prescription after SI, and his SI resolved. Pt states he is not interested taking and antidepressants at this time.  Past Hx: Pt was last seen in May of 2015 for follow up concerning anxiety, and at that time also requested valium for chronic back and neck pain. By his report he had no recent orthopedic evaluation or pain report, but states he was seen 10 years earlier by orthopedics for neck pain due to an injury. Pt stated he had a neck fracture at age 31, where surgery was recommended but no surgery was performed. X-Osborn vs MRI vs visit with orthopedics for pain management, but initially treated with flexeril and neck care manual. 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 Appointment Dr. Kathyrn Sheriff on October 19th   Pt [facility] to [facility] in July 2014, and  saw Dr. Edilia Bo and was prescribed wellbutrin 150mg , and xanax .5mg  QHS QRN. Pt stated at the time he was having stress at his job, as well as stress with buying a new home and getting married. Pt reported at that time he was taking xanax once per week during day and two times per week at night at that time. Pt states that over subsequent months he was able to reduce his xanax usage to 3 times per month. Pt declined changing his medication to SSRIs due to concerns of experiences that  friends or family have had taking these medications. Pt was recommended recheck in three moths for anxiety sx.   Patient Active Problem List   Diagnosis Date Noted  . Generalized anxiety disorder 11/04/2012   Past Medical History  Diagnosis Date  . Anxiety    No past surgical history on file. No Known Allergies Prior to Admission medications   Medication Sig Start Date End Date Taking? Authorizing Provider  ALPRAZolam (XANAX) 1 MG tablet TAKE 1/2 TO 1 TABLET BY MOUTH THREE TIMES A DAY AS NEEDED FOR ANXIETY AND SLEEP Patient taking differently: TAKE 1/2 TO 1 TABLET BY MOUTH TWO TIMES A DAY AS NEEDED FOR ANXIETY AND SLEEP 01/29/14  Yes Roselee Culver, MD  valACYclovir (VALTREX) 1000 MG tablet Take 1 tablet (1,000 mg total) by mouth 3 (three) times daily. 01/29/14  Yes Roselee Culver, MD  HYDROcodone-acetaminophen (NORCO) 5-325 MG per tablet Take 1-2 tablets by mouth every 4 (four) hours as needed. Patient not taking: Reported on 05/05/2014 01/29/14   Roselee Culver, MD   History   Social History  . Marital Status: Single    Spouse Name: N/A    Number of Children: N/A  . Years of Education: N/A   Occupational History  . Not on file.   Social History Main Topics  . Smoking status: Light Tobacco Smoker -- 20 years    Types: Cigarettes  . Smokeless tobacco: Not on file  . Alcohol Use: No  . Drug Use: No  . Sexual Activity: Not on file   Other Topics Concern  . Not on file   Social History Narrative    Review of Systems  Gastrointestinal: Positive for nausea and diarrhea.  Musculoskeletal: Positive for back pain.  Neurological: Positive for headaches.  Psychiatric/Behavioral: Positive for sleep disturbance. The patient is nervous/anxious.       Objective:   Physical Exam  Constitutional: He is oriented to person, place, and time. He appears well-developed and well-nourished.  HENT:  Head: Normocephalic and atraumatic.  Mouth/Throat: Oropharynx is clear and  moist.  Eyes: Pupils are equal, round, and reactive to light.  Neck: Neck supple.  Cardiovascular: Normal rate and regular rhythm.   Pulmonary/Chest: Effort normal and breath sounds normal. No respiratory distress.  Abdominal: Soft. Bowel sounds are normal.  Neurological: He is alert and oriented to person, place, and time.  Skin: Skin is warm and dry.  Psychiatric: He has a normal mood and affect. His behavior is normal.  Nursing note and vitals reviewed.   Filed Vitals:   05/05/14 1237  BP: 98/48  Pulse: 74  Temp: 98.5 F (36.9 C)  Resp: 16  Height: 5\' 11"  (1.803 m)  Weight: 175 lb (79.379 kg)  SpO2: 99%      Assessment & Plan:   Elma Limas is a 39 y.o. male Genital HSV - Plan: valACYclovir (VALTREX) 1000 MG tablet  - refilled Valtrex. Use discussed and could consider daily  prophylaxis.   Anxiety - Plan: ALPRAZolam (XANAX) 1 MG tablet  -denies depression at this time, more of anxiety than depressive symptoms and off Wellbutrin. Refilled xanax for now, but discussed need for counseling /CBT as part of his treatment (numbers provided), and may need to look at SSRI again if persisitent symptoms. recheck in next 3-4 months.  Nausea - Plan: POCT CBC, ondansetron (ZOFRAN ODT) 4 MG disintegrating tablet, Diarrhea - Plan: POCT CBC, Stool culture, Ova and parasite examination  - viral vs rebound after stopping narcotics. Reassuring exam and VSS.  Will check stool studies, sx care discussed in AVS.   Back pain, unspecified location  - chronic with plan on follow up in next few days with neurosurgeon.   Meds ordered this encounter  Medications  . valACYclovir (VALTREX) 1000 MG tablet    Sig: Take 1 tablet (1,000 mg total) by mouth daily. For 5 days with any sign or symptoms of recurrent flair.    Dispense:  30 tablet    Refill:  6  . ALPRAZolam (XANAX) 1 MG tablet    Sig: TAKE 1/2 TO 1 TABLET BY MOUTH THREE TIMES A DAY AS NEEDED FOR ANXIETY AND SLEEP    Dispense:  30 tablet      Refill:  0  . ondansetron (ZOFRAN ODT) 4 MG disintegrating tablet    Sig: Take 1 tablet (4 mg total) by mouth every 8 (eight) hours as needed for nausea or vomiting.    Dispense:  10 tablet    Refill:  0   Patient Instructions  I refilled the xanax for now. We may need to look into other daily medicines, but can start with just this for now.  If you have any worsening of your anxiety or any return of depression symptoms - especially if any suicidal thoughts - return here or  emergency room for evaluation. I think counseling will help with stress and stress management.  I can give you more names, but can start with: [place]: [phone]  Follow up with your neurosurgeon as planned to discuss options for your back pain.   For the nausea - you can take zofran if needed, pepto bismol and if needed, immodium for the diarrhea.  Drink small sips of fluids frequently. If you measure a fever, worsening abdominal pain or otherwise worsening - return for recheck.   Follow up in the next 3-4 months to discuss medication further.   Return to the clinic or go to the nearest emergency room if any of your symptoms worsen or new symptoms occur.   Diarrhea Diarrhea is frequent loose and watery bowel movements. It can cause you to feel weak and dehydrated. Dehydration can cause you to become tired and thirsty, have a dry mouth, and have decreased urination that often is dark yellow. Diarrhea is a sign of another problem, most often an infection that will not last long. In most cases, diarrhea typically lasts 2-3 days. However, it can last longer if it is a sign of something more serious. It is important to treat your diarrhea as directed by your caregiver to lessen or prevent future episodes of diarrhea. CAUSES  Some common causes include:  Gastrointestinal infections caused by viruses, bacteria, or parasites.  Food poisoning or food allergies.  Certain medicines, such as  antibiotics, chemotherapy, and laxatives.  Artificial sweeteners and fructose.  Digestive disorders. HOME CARE INSTRUCTIONS  Ensure adequate fluid intake (hydration): Have 1 cup (8 oz) of fluid for each  diarrhea episode. Avoid fluids that contain simple sugars or sports drinks, fruit juices, whole milk products, and sodas. Your urine should be clear or pale yellow if you are drinking enough fluids. Hydrate with an oral rehydration solution that you can purchase at pharmacies, retail stores, and online. You can prepare an oral rehydration solution at home by mixing the following ingredients together:   - tsp table salt.   tsp baking soda.   tsp salt substitute containing potassium chloride.  1  tablespoons sugar.  1 L (34 oz) of water.  Certain foods and beverages may increase the speed at which food moves through the gastrointestinal (GI) tract. These foods and beverages should be avoided and include:  Caffeinated and alcoholic beverages.  High-fiber foods, such as raw fruits and vegetables, nuts, seeds, and whole grain breads and cereals.  Foods and beverages sweetened with sugar alcohols, such as xylitol, sorbitol, and mannitol.  Some foods may be well tolerated and may help thicken stool including:  Starchy foods, such as rice, toast, pasta, low-sugar cereal, oatmeal, grits, baked potatoes, crackers, and bagels.  Bananas.  Applesauce.  Add probiotic-rich foods to help increase healthy bacteria in the GI tract, such as yogurt and fermented milk products.  Wash your hands well after each diarrhea episode.  Only take over-the-counter or prescription medicines as directed by your caregiver.  Take a warm bath to relieve any burning or pain from frequent diarrhea episodes. SEEK IMMEDIATE MEDICAL CARE IF:   You are unable to keep fluids down.  You have persistent vomiting.  You have blood in your stool, or your stools are black and tarry.  You do not urinate in 6-8  hours, or there is only a small amount of very dark urine.  You have abdominal pain that increases or localizes.  You have weakness, dizziness, confusion, or light-headedness.  You have a severe headache.  Your diarrhea gets worse or does not get better.  You have a fever or persistent symptoms for more than 2-3 days.  You have a fever and your symptoms suddenly get worse. MAKE SURE YOU:   Understand these instructions.  Will watch your condition.  Will get help right away if you are not doing well or get worse. Document Released: 04/07/2002 Document Revised: 09/01/2013 Document Reviewed: 12/24/2011 [facility] Patient Information 2015 [place], [place]. This information is not intended to replace advice given to you by your health care provider. Make sure you discuss any questions you have with your health care provider.     I personally performed the services described in this documentation, which was scribed in my presence. The recorded information has been reviewed and considered, and addended by me as needed.

## 2014-05-05 NOTE — Patient Instructions (Addendum)
I refilled the xanax for now. We may need to look into other daily medicines, but can start with just this for now.  If you have any worsening of your anxiety or any return of depression symptoms - especially if any suicidal thoughts - return here or  emergency room for evaluation. I think counseling will help with stress and stress management.  I can give you more names, but can start with: [place]: [phone]  Follow up with your neurosurgeon as planned to discuss options for your back pain.   For the nausea - you can take zofran if needed, pepto bismol and if needed, immodium for the diarrhea.  Drink small sips of fluids frequently. If you measure a fever, worsening abdominal pain or otherwise worsening - return for recheck.   Follow up in the next 3-4 months to discuss medication further.   Return to the clinic or go to the nearest emergency room if any of your symptoms worsen or new symptoms occur.   Diarrhea Diarrhea is frequent loose and watery bowel movements. It can cause you to feel weak and dehydrated. Dehydration can cause you to become tired and thirsty, have a dry mouth, and have decreased urination that often is dark yellow. Diarrhea is a sign of another problem, most often an infection that will not last long. In most cases, diarrhea typically lasts 2-3 days. However, it can last longer if it is a sign of something more serious. It is important to treat your diarrhea as directed by your caregiver to lessen or prevent future episodes of diarrhea. CAUSES  Some common causes include:  Gastrointestinal infections caused by viruses, bacteria, or parasites.  Food poisoning or food allergies.  Certain medicines, such as antibiotics, chemotherapy, and laxatives.  Artificial sweeteners and fructose.  Digestive disorders. HOME CARE INSTRUCTIONS  Ensure adequate fluid intake (hydration): Have 1 cup (8 oz) of fluid for each diarrhea episode. Avoid fluids that  contain simple sugars or sports drinks, fruit juices, whole milk products, and sodas. Your urine should be clear or pale yellow if you are drinking enough fluids. Hydrate with an oral rehydration solution that you can purchase at pharmacies, retail stores, and online. You can prepare an oral rehydration solution at home by mixing the following ingredients together:   - tsp table salt.   tsp baking soda.   tsp salt substitute containing potassium chloride.  1  tablespoons sugar.  1 L (34 oz) of water.  Certain foods and beverages may increase the speed at which food moves through the gastrointestinal (GI) tract. These foods and beverages should be avoided and include:  Caffeinated and alcoholic beverages.  High-fiber foods, such as raw fruits and vegetables, nuts, seeds, and whole grain breads and cereals.  Foods and beverages sweetened with sugar alcohols, such as xylitol, sorbitol, and mannitol.  Some foods may be well tolerated and may help thicken stool including:  Starchy foods, such as rice, toast, pasta, low-sugar cereal, oatmeal, grits, baked potatoes, crackers, and bagels.  Bananas.  Applesauce.  Add probiotic-rich foods to help increase healthy bacteria in the GI tract, such as yogurt and fermented milk products.  Wash your hands well after each diarrhea episode.  Only take over-the-counter or prescription medicines as directed by your caregiver.  Take a warm bath to relieve any burning or pain from frequent diarrhea episodes. SEEK IMMEDIATE MEDICAL CARE IF:   You are unable to keep fluids down.  You have persistent vomiting.  You have  blood in your stool, or your stools are black and tarry.  You do not urinate in 6-8 hours, or there is only a small amount of very dark urine.  You have abdominal pain that increases or localizes.  You have weakness, dizziness, confusion, or light-headedness.  You have a severe headache.  Your diarrhea gets worse or does not  get better.  You have a fever or persistent symptoms for more than 2-3 days.  You have a fever and your symptoms suddenly get worse. MAKE SURE YOU:   Understand these instructions.  Will watch your condition.  Will get help right away if you are not doing well or get worse. Document Released: 04/07/2002 Document Revised: 09/01/2013 Document Reviewed: 12/24/2011 [facility] Patient Information 2015 [place], [place]. This information is not intended to replace advice given to you by your health care provider. Make sure you discuss any questions you have with your health care provider.

## 2014-05-07 LAB — OVA AND PARASITE EXAMINATION: OP: NONE SEEN

## 2014-05-09 LAB — STOOL CULTURE

## 2015-03-09 ENCOUNTER — Other Ambulatory Visit: Payer: Self-pay | Admitting: Family Medicine

## 2017-01-23 ENCOUNTER — Encounter (HOSPITAL_COMMUNITY): Payer: Self-pay | Admitting: *Deleted

## 2017-01-23 ENCOUNTER — Emergency Department (HOSPITAL_COMMUNITY): Payer: Managed Care, Other (non HMO)

## 2017-01-23 ENCOUNTER — Observation Stay (HOSPITAL_COMMUNITY)
Admission: EM | Admit: 2017-01-23 | Discharge: 2017-01-24 | Disposition: A | Discharge status: Home / Self Care | Payer: Managed Care, Other (non HMO) | Attending: Internal Medicine | Admitting: Internal Medicine

## 2017-01-23 DIAGNOSIS — R531 Weakness: Secondary | ICD-10-CM | POA: Insufficient documentation

## 2017-01-23 DIAGNOSIS — M5441 Lumbago with sciatica, right side: Secondary | ICD-10-CM

## 2017-01-23 DIAGNOSIS — F17210 Nicotine dependence, cigarettes, uncomplicated: Secondary | ICD-10-CM | POA: Insufficient documentation

## 2017-01-23 DIAGNOSIS — F1910 Other psychoactive substance abuse, uncomplicated: Secondary | ICD-10-CM

## 2017-01-23 DIAGNOSIS — R451 Restlessness and agitation: Secondary | ICD-10-CM | POA: Diagnosis not present

## 2017-01-23 DIAGNOSIS — G8929 Other chronic pain: Secondary | ICD-10-CM | POA: Insufficient documentation

## 2017-01-23 DIAGNOSIS — F1410 Cocaine abuse, uncomplicated: Secondary | ICD-10-CM | POA: Insufficient documentation

## 2017-01-23 DIAGNOSIS — F411 Generalized anxiety disorder: Secondary | ICD-10-CM | POA: Diagnosis not present

## 2017-01-23 DIAGNOSIS — R519 Headache, unspecified: Secondary | ICD-10-CM | POA: Diagnosis present

## 2017-01-23 DIAGNOSIS — F1310 Sedative, hypnotic or anxiolytic abuse, uncomplicated: Secondary | ICD-10-CM | POA: Diagnosis not present

## 2017-01-23 DIAGNOSIS — R4182 Altered mental status, unspecified: Principal | ICD-10-CM | POA: Insufficient documentation

## 2017-01-23 DIAGNOSIS — R51 Headache: Secondary | ICD-10-CM | POA: Diagnosis not present

## 2017-01-23 DIAGNOSIS — F419 Anxiety disorder, unspecified: Secondary | ICD-10-CM | POA: Diagnosis not present

## 2017-01-23 DIAGNOSIS — G894 Chronic pain syndrome: Secondary | ICD-10-CM | POA: Diagnosis not present

## 2017-01-23 DIAGNOSIS — R410 Disorientation, unspecified: Secondary | ICD-10-CM | POA: Diagnosis present

## 2017-01-23 DIAGNOSIS — E871 Hypo-osmolality and hyponatremia: Secondary | ICD-10-CM

## 2017-01-23 DIAGNOSIS — Z79899 Other long term (current) drug therapy: Secondary | ICD-10-CM | POA: Insufficient documentation

## 2017-01-23 DIAGNOSIS — R42 Dizziness and giddiness: Secondary | ICD-10-CM | POA: Diagnosis not present

## 2017-01-23 LAB — I-STAT CG4 LACTIC ACID, ED
LACTIC ACID, VENOUS: 3.67 mmol/L — AB (ref 0.5–1.9)
LACTIC ACID, VENOUS: 0.64 mmol/L (ref 0.5–1.9)

## 2017-01-23 LAB — CBC WITH DIFFERENTIAL/PLATELET
BASOS ABS: 0 10*3/uL (ref 0.0–0.1)
BASOS PCT: 0 %
EOS ABS: 0 10*3/uL (ref 0.0–0.7)
EOS PCT: 0 %
HCT: 39.5 % (ref 39.0–52.0)
HEMOGLOBIN: 13.1 g/dL (ref 13.0–17.0)
Lymphocytes Relative: 23 %
Lymphs Abs: 2.6 10*3/uL (ref 0.7–4.0)
MCH: 30.9 pg (ref 26.0–34.0)
MCHC: 33.2 g/dL (ref 30.0–36.0)
MCV: 93.2 fL (ref 78.0–100.0)
Monocytes Absolute: 0.6 10*3/uL (ref 0.1–1.0)
Monocytes Relative: 5 %
NEUTROS PCT: 72 %
Neutro Abs: 8.2 10*3/uL — ABNORMAL HIGH (ref 1.7–7.7)
PLATELETS: 299 10*3/uL (ref 150–400)
RBC: 4.24 MIL/uL (ref 4.22–5.81)
RDW: 14.1 % (ref 11.5–15.5)
WBC: 11.4 10*3/uL — AB (ref 4.0–10.5)

## 2017-01-23 LAB — URINALYSIS, ROUTINE W REFLEX MICROSCOPIC
Bilirubin Urine: NEGATIVE
GLUCOSE, UA: 50 mg/dL — AB
Hgb urine dipstick: NEGATIVE
KETONES UR: 20 mg/dL — AB
LEUKOCYTES UA: NEGATIVE
NITRITE: NEGATIVE
PH: 8 (ref 5.0–8.0)
Protein, ur: NEGATIVE mg/dL
SPECIFIC GRAVITY, URINE: 1.018 (ref 1.005–1.030)

## 2017-01-23 LAB — COMPREHENSIVE METABOLIC PANEL
ALK PHOS: 53 U/L (ref 38–126)
ALT: 7 U/L — AB (ref 17–63)
AST: 20 U/L (ref 15–41)
Albumin: 4 g/dL (ref 3.5–5.0)
Anion gap: 10 (ref 5–15)
BUN: 12 mg/dL (ref 6–20)
CALCIUM: 8.9 mg/dL (ref 8.9–10.3)
CO2: 23 mmol/L (ref 22–32)
CREATININE: 0.93 mg/dL (ref 0.61–1.24)
Chloride: 100 mmol/L — ABNORMAL LOW (ref 101–111)
Glucose, Bld: 140 mg/dL — ABNORMAL HIGH (ref 65–99)
Potassium: 3.6 mmol/L (ref 3.5–5.1)
Sodium: 133 mmol/L — ABNORMAL LOW (ref 135–145)
Total Bilirubin: 0.6 mg/dL (ref 0.3–1.2)
Total Protein: 7.1 g/dL (ref 6.5–8.1)

## 2017-01-23 LAB — RAPID URINE DRUG SCREEN, HOSP PERFORMED
Amphetamines: NOT DETECTED
BARBITURATES: NOT DETECTED
Benzodiazepines: POSITIVE — AB
Cocaine: POSITIVE — AB
Opiates: NOT DETECTED
TETRAHYDROCANNABINOL: POSITIVE — AB

## 2017-01-23 LAB — I-STAT VENOUS BLOOD GAS, ED
Acid-Base Excess: 3 mmol/L — ABNORMAL HIGH (ref 0.0–2.0)
Bicarbonate: 25.2 mmol/L (ref 20.0–28.0)
O2 SAT: 66 %
PH VEN: 7.497 — AB (ref 7.250–7.430)
PO2 VEN: 31 mmHg — AB (ref 32.0–45.0)
TCO2: 26 mmol/L (ref 22–32)
pCO2, Ven: 32.6 mmHg — ABNORMAL LOW (ref 44.0–60.0)

## 2017-01-23 LAB — ETHANOL: Alcohol, Ethyl (B): <10 mg/dL — ABNORMAL HIGH (ref <5)

## 2017-01-23 LAB — I-STAT CHEM 8, ED
BUN: 14 mg/dL (ref 6–20)
CHLORIDE: 99 mmol/L — AB (ref 101–111)
CREATININE: 0.7 mg/dL (ref 0.61–1.24)
Calcium, Ion: 1.07 mmol/L — ABNORMAL LOW (ref 1.15–1.40)
GLUCOSE: 141 mg/dL — AB (ref 65–99)
HEMATOCRIT: 44 % (ref 39.0–52.0)
Hemoglobin: 15 g/dL (ref 13.0–17.0)
POTASSIUM: 3.5 mmol/L (ref 3.5–5.1)
Sodium: 137 mmol/L (ref 135–145)
TCO2: 24 mmol/L (ref 22–32)

## 2017-01-23 LAB — AMMONIA: AMMONIA: 30 umol/L (ref 9–35)

## 2017-01-23 MED ORDER — TIZANIDINE HCL 4 MG PO TABS: 4.0000 mg | ORAL_TABLET | Freq: Three times a day (TID) | ORAL | Status: DC | PRN

## 2017-01-23 MED ORDER — FOLIC ACID 5 MG/ML IJ SOLN
1.0000 mg | Freq: Every day | INTRAMUSCULAR | Status: DC
  Administered 2017-01-24: 1 mg via INTRAVENOUS
  Filled 2017-01-23: qty 0.2

## 2017-01-23 MED ORDER — DIPHENHYDRAMINE HCL 50 MG/ML IJ SOLN
25.0000 mg | Freq: Once | INTRAMUSCULAR | Status: DC
  Filled 2017-01-23: qty 1

## 2017-01-23 MED ORDER — LORAZEPAM 2 MG/ML IJ SOLN
2.0000 mg | INTRAMUSCULAR | Status: DC | PRN
  Administered 2017-01-24: 2 mg via INTRAVENOUS
  Filled 2017-01-23: qty 2

## 2017-01-23 MED ORDER — INSULIN ASPART 100 UNIT/ML ~~LOC~~ SOLN: 0.0000 [IU] | SUBCUTANEOUS | Status: DC

## 2017-01-23 MED ORDER — ONDANSETRON HCL 4 MG PO TABS
4.0000 mg | ORAL_TABLET | Freq: Four times a day (QID) | ORAL | Status: DC | PRN
Start: 2017-01-23 — End: 2017-01-24

## 2017-01-23 MED ORDER — HALOPERIDOL LACTATE 5 MG/ML IJ SOLN
INTRAMUSCULAR | Status: AC
  Filled 2017-01-23: qty 1

## 2017-01-23 MED ORDER — HALOPERIDOL LACTATE 5 MG/ML IJ SOLN
5.0000 mg | Freq: Four times a day (QID) | INTRAMUSCULAR | Status: DC | PRN
  Administered 2017-01-23 (×2): 5 mg via INTRAVENOUS

## 2017-01-23 MED ORDER — BISACODYL 5 MG PO TBEC: 5.0000 mg | DELAYED_RELEASE_TABLET | Freq: Every day | ORAL | Status: DC | PRN

## 2017-01-23 MED ORDER — ACETAMINOPHEN 650 MG RE SUPP: 650.0000 mg | Freq: Four times a day (QID) | RECTAL | Status: DC | PRN

## 2017-01-23 MED ORDER — KETOROLAC TROMETHAMINE 30 MG/ML IJ SOLN
30.0000 mg | Freq: Four times a day (QID) | INTRAMUSCULAR | Status: DC | PRN
  Administered 2017-01-24: 30 mg via INTRAVENOUS
  Filled 2017-01-23: qty 1

## 2017-01-23 MED ORDER — PROCHLORPERAZINE EDISYLATE 5 MG/ML IJ SOLN
10.0000 mg | Freq: Once | INTRAMUSCULAR | Status: DC
  Filled 2017-01-23: qty 2

## 2017-01-23 MED ORDER — MIDAZOLAM HCL 2 MG/2ML IJ SOLN
INTRAMUSCULAR | Status: AC
  Filled 2017-01-23: qty 6

## 2017-01-23 MED ORDER — SENNOSIDES-DOCUSATE SODIUM 8.6-50 MG PO TABS
1.0000 | ORAL_TABLET | Freq: Every evening | ORAL | Status: DC | PRN
Start: 2017-01-23 — End: 2017-01-24

## 2017-01-23 MED ORDER — DOCUSATE SODIUM 50 MG PO CAPS
50.0000 mg | ORAL_CAPSULE | Freq: Every day | ORAL | Status: DC
  Administered 2017-01-24: 50 mg via ORAL
  Filled 2017-01-23: qty 1

## 2017-01-23 MED ORDER — ENOXAPARIN SODIUM 40 MG/0.4ML ~~LOC~~ SOLN: 40.0000 mg | SUBCUTANEOUS | Status: DC

## 2017-01-23 MED ORDER — MIDAZOLAM HCL 5 MG/5ML IJ SOLN
6.0000 mg | Freq: Once | INTRAMUSCULAR | Status: AC
  Administered 2017-01-23: 6 mg via INTRAVENOUS

## 2017-01-23 MED ORDER — SODIUM CHLORIDE 0.9 % IV SOLN
INTRAVENOUS | Status: DC
  Administered 2017-01-24: 05:00:00 via INTRAVENOUS

## 2017-01-23 MED ORDER — ACETAMINOPHEN 325 MG PO TABS: 650.0000 mg | ORAL_TABLET | Freq: Four times a day (QID) | ORAL | Status: DC | PRN

## 2017-01-23 MED ORDER — SODIUM CHLORIDE 0.9% FLUSH
3.0000 mL | Freq: Two times a day (BID) | INTRAVENOUS | Status: DC
  Administered 2017-01-24: 3 mL via INTRAVENOUS

## 2017-01-23 MED ORDER — OXYCODONE-ACETAMINOPHEN 7.5-325 MG PO TABS: 1.0000 | ORAL_TABLET | ORAL | Status: DC | PRN

## 2017-01-23 MED ORDER — DIPHENHYDRAMINE HCL 50 MG/ML IJ SOLN
25.0000 mg | Freq: Once | INTRAMUSCULAR | Status: AC
  Administered 2017-01-23: 25 mg via INTRAVENOUS

## 2017-01-23 MED ORDER — LORAZEPAM 2 MG/ML IJ SOLN
2.0000 mg | Freq: Once | INTRAMUSCULAR | Status: AC
  Administered 2017-01-23: 2 mg via INTRAVENOUS
  Filled 2017-01-23: qty 1

## 2017-01-23 MED ORDER — VITAMIN B-1 100 MG PO TABS
100.0000 mg | ORAL_TABLET | Freq: Every day | ORAL | Status: DC
  Administered 2017-01-24: 100 mg via ORAL
  Filled 2017-01-23: qty 1

## 2017-01-23 MED ORDER — ADULT MULTIVITAMIN W/MINERALS CH
1.0000 | ORAL_TABLET | Freq: Every day | ORAL | Status: DC
  Administered 2017-01-24: 1 via ORAL
  Filled 2017-01-23: qty 1

## 2017-01-23 MED ORDER — ONDANSETRON HCL 4 MG/2ML IJ SOLN
4.0000 mg | Freq: Four times a day (QID) | INTRAMUSCULAR | Status: DC | PRN
Start: 2017-01-23 — End: 2017-01-24

## 2017-01-23 MED ORDER — KETAMINE HCL-SODIUM CHLORIDE 100-0.9 MG/10ML-% IV SOSY
100.0000 mg | PREFILLED_SYRINGE | Freq: Once | INTRAVENOUS | Status: AC
  Administered 2017-01-23: 100 mg via INTRAVENOUS
  Filled 2017-01-23: qty 10

## 2017-01-23 MED ORDER — SODIUM CHLORIDE 0.9 % IV BOLUS (SEPSIS)
1000.0000 mL | Freq: Once | INTRAVENOUS | Status: AC
  Administered 2017-01-23: 1000 mL via INTRAVENOUS

## 2017-01-23 MED ORDER — LORAZEPAM 2 MG/ML IJ SOLN
INTRAMUSCULAR | Status: AC
  Filled 2017-01-23: qty 1

## 2017-01-23 MED ORDER — PROCHLORPERAZINE EDISYLATE 5 MG/ML IJ SOLN
10.0000 mg | Freq: Once | INTRAMUSCULAR | Status: AC
  Administered 2017-01-23: 10 mg via INTRAVENOUS

## 2017-01-23 MED ORDER — LORAZEPAM 2 MG/ML IJ SOLN
1.0000 mg | Freq: Once | INTRAMUSCULAR | Status: AC
  Administered 2017-01-23: 1 mg via INTRAVENOUS

## 2017-01-23 MED ORDER — SODIUM CHLORIDE 0.9 % IV BOLUS (SEPSIS)
2000.0000 mL | Freq: Once | INTRAVENOUS | Status: AC
  Administered 2017-01-23: 2000 mL via INTRAVENOUS

## 2017-01-23 NOTE — ED Provider Notes (Signed)
6:56 PM Family is here and I have discussed with them his presentation. Briefly, patient has had acute headache and dizziness since this morning around 11 AM. Dizziness worse with standing. Then had some speech trouble, which sounded more like trouble getting the right word out rather than slurred speech. Never had any fevers. Then he was here and saying he could move his legs but he would respond normally with painful stimuli and would move his legs after that. At some point he became quite agitated as per Dr. Miachel Roux note. He was given multiple IM medications and now has been sleeping. Thus he has been confused since I have seen him although this is clouded by medications. 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 This is likely playing a role as well. There could be a CSF leak causing some of the symptoms. He was complaining of diffuse arm and leg tingling and thus will get MRI given his altered mental status.  11:32 PM Discussed patient with Dr. Myna Hidalgo who will come to admit. Unfortunately, patient has been unable to get MRI because he wakes up and does not lay still. He has been given a couple extra doses of IV Ativan but at this point I don't think there is more I can do to sedate him to get an MRI. If MRI is still needed he will need to have anesthesia sedate him. Given he is still altered and lethargic he will need to be observed in the hospital. He has been moving his neck freely when awake and has been afebrile. I do not think he has meningitis.   Sherwood Gambler, MD 01/23/17 [phone]

## 2017-01-23 NOTE — ED Notes (Signed)
Pt's wife at bedside at this time.

## 2017-01-23 NOTE — ED Notes (Signed)
Pt resting quietly at this time.  Pt's wife and mother at bedside

## 2017-01-23 NOTE — ED Notes (Signed)
Pt continues to try to get up, yelling, cursing, kicking and spitting.  Orders received from Dr. Tyrone Nine. Security and [employer] at bedside

## 2017-01-23 NOTE — Consult Note (Signed)
Laurel Dimmer Complaint   Chief Complaint  Patient presents with  . Weakness    HPI   HPI: Zachary Osborn is a 41 y.o. male who presents to ER for acute onset dizziness 1145am. S/P laminectomy by Dr Kathyrn Sheriff Friday, 9/21.  Was doing well up until that time. Dizziness associated with headache and generalized weakness.  History given by girlfriend at bedside as patient refuses to give history. No fever. No concerns with surgical site.   Patient Active Problem List   Diagnosis Date Noted  . Generalized anxiety disorder 11/04/2012    PMH: Past Medical History:  Diagnosis Date  . Anxiety     PSH: Past Surgical History:  Procedure Laterality Date  . BACK SURGERY      (Not in a hospital admission)  SH: Social History  Substance Use Topics  . Smoking status: Light Tobacco Smoker    Years: 20.00    Types: Cigarettes  . Smokeless tobacco: Never Used  . Alcohol use No    MEDS: Prior to Admission medications   Medication Sig Start Date End Date Taking? Authorizing Provider  ALPRAZolam (XANAX) 1 MG tablet TAKE 1/2 TO 1 TABLET BY MOUTH THREE TIMES A DAY AS NEEDED FOR ANXIETY AND SLEEP 05/05/14   Wendie Agreste, MD  ondansetron (ZOFRAN ODT) 4 MG disintegrating tablet Take 1 tablet (4 mg total) by mouth every 8 (eight) hours as needed for nausea or vomiting. 05/05/14   Wendie Agreste, MD  valACYclovir (VALTREX) 1000 MG tablet Take 1 tablet (1,000 mg total) by mouth daily. For 5 days with any sign or symptoms of recurrent flair. 05/05/14   Wendie Agreste, MD    ALLERGY: No Known Allergies  Social History  Substance Use Topics  . Smoking status: Light Tobacco Smoker    Years: 20.00    Types: Cigarettes  . Smokeless tobacco: Never Used  . Alcohol use No     No family history on file.   ROS   ROS unable to obtain due to patient cooperation   Exam   Vitals:   01/23/17 1414  BP: 133/89  Pulse: 89  Resp: (!) 21  Temp: 97.9 F (36.6 C)  SpO2: 97%   General  appearance: WDWN, rolling head back and forth. Keeps head down with chin to chest.  Eyes: PERRL Cardiovascular: Regular rate and rhythm without murmurs, rubs, gallops. No edema or variciosities. Distal pulses normal. Pulmonary: Clear to auscultation Musculoskeletal:     Muscle tone upper extremities: Normal    Muscle tone lower extremities: Normal    Motor exam: Refuses to actively move extremities on command. Will move all extremities anti-gravity to pain.  Skin: surgical site is clean, dry, intact. There is no swelling, redness, warmth, drainage, fluctuance. Healing well. Neurological Does not answer questions. Refuses to follow commands.  CNII: Visual fields normal CNIII/IV/VI: EOMI CNV: Facial sensation normal CNVII: Symmetric, normal strength CNVIII: Grossly normal CNIX: Normal palate movement CNXI: Trap and SCM strength normal CN XII: Tongue protrusion normal Sensation grossly intact to LT DTR: Normal Coordination (finger/nose & heel/shin): unable to check  Results - Imaging/Labs   No results found for this or any previous visit (from the past 48 hour(s)).  No results found.  Impression/Plan   41 y.o. male with acute onset dizziness 1145am today. He is s/p laminectomy by [facility] this past Friday. Difficult to examine as he refuses to answer questions and is not compliant with examination. He does move all extremities to pain. Sensation does  appear grossly intact based on response to pain. His surgical site is healing well and without evidence of infection or pseudomeningocele. Presently, vitals unremarkable. Do not immediately see a cause for his symptoms related to his surgery. Do not believe MRI lumbar spine is warranted at this time. Discussed with EDP.

## 2017-01-23 NOTE — ED Notes (Signed)
Pt returned from CT °

## 2017-01-23 NOTE — ED Triage Notes (Addendum)
PT had a laminectomy on Friday. Then started having dizziness and headache at 1145 today.  Pt is stating that he cannot move legs and his back hurts a lot.  Pt is talking softly and tearful.  Arms drawn up. Pt had headache yesterday too

## 2017-01-23 NOTE — ED Notes (Signed)
ED Provider at bedside. 

## 2017-01-23 NOTE — Progress Notes (Signed)
Received call from nursing that patient acutely became agitated and started becoming aggressive towards nursing staff. Sounds like ?acute psychosis. Do not believe this is related to his surgery.

## 2017-01-23 NOTE — ED Notes (Signed)
Pt trying to get out of bed.  Not answering questions, trying to stand up.  Pt placed back on bed.  Continues to pull electrodes off and try to get up.  Pt's wife at bedside attempting to calm pt down

## 2017-01-23 NOTE — H&P (Signed)
History and Physical    Zachary Osborn LPF:[id number] DOB: 1975/07/28 DOA: 01/23/2017  PCP: Patient, No Pcp Per   Patient coming from: Home  Chief Complaint: Headache, back pain    HPI: Zachary Osborn is a 41 y.o. male with medical history significant for anxiety, substance abuse, and chronic back pain status post laminectomy on 01/19/2017, now presenting to the emergency department for evaluation of back pain and headache. Patient had reportedly been in his usual state with chronic back pain leading up to the surgery, and was scheduled for follow-up with neurosurgery today in the clinic. He had developed severe back pain and a headache, did not feel that he could wait until his appointment, and so he came into the ED. Patient had told the ED provider that his problems began 2 days ago and have been constant. It appears that he actually denied a headache to the initial ED provider. He reported back pain that was worse with movements and with no alleviating factors identified. Patient since became agitated, was heavily sedated, and is unable to provide a history.   ED Course: Upon arrival to the ED, patient is found to be afebrile, saturating well on room air, and with vitals otherwise stable. EKG features a normal sinus rhythm and noncontrast head CT is negative for acute intracranial abnormality. Chemistry panel reveals a sodium of 133 and glucose of 140. Ethanol level is undetectable. CBC features a slight leukocytosis to 11,400 and initial lactic acid was elevated to 3.67. Urinalysis was unremarkable and UDS was positive for benzodiazepines, cocaine, and THC. Lactate normalized with IV fluids. Neurosurgery was consulted and evaluated the patient in the emergency department, and with low suspicion for a surgical complication leading to this presentation. Patient was initially uncooperative with interview and exam per report, and later became very agitated, striking and spitting ED personnel. He was  treated with multiple doses of Ativan, Versed, Haldol, and ketamine in the emergency department. He became heavily sedated, remains hemodynamically stable, and his not in any apparent respiratory distress. He will be observed in the stepdown unit for ongoing evaluation and management of acute agitation, preceded by headache and back pain.  Review of Systems:  Unable to complete ROS secondary to the patient's clinical condition.  Past Medical History:  Diagnosis Date  . Anxiety     Past Surgical History:  Procedure Laterality Date  . BACK SURGERY       reports that he has been smoking Cigarettes.  He has smoked for the past 20.00 years. He has never used smokeless tobacco. He reports that he does not drink alcohol or use drugs.  No Known Allergies  History reviewed. No pertinent family history.   Prior to Admission medications   Medication Sig Start Date End Date Taking? Authorizing Provider  docusate sodium (COLACE) 50 MG capsule Take 50 mg by mouth daily.   Yes [provider]  ibuprofen (ADVIL,MOTRIN) 200 MG tablet Take 400 mg by mouth every 6 (six) hours as needed.   Yes [provider]  oxyCODONE-acetaminophen (PERCOCET) 7.5-325 MG tablet Take 1 tablet by mouth every 4 (four) hours as needed. 01/19/17  Yes [provider]  polyethylene glycol powder (GLYCOLAX/MIRALAX) powder Take 1 Container by mouth once.   Yes [provider]  tiZANidine (ZANAFLEX) 4 MG tablet Take 4 mg by mouth every 8 (eight) hours as needed. 01/04/17  Yes [provider]  valACYclovir (VALTREX) 1000 MG tablet Take 1 tablet (1,000 mg total) by mouth daily. For 5  days with any sign or symptoms of recurrent flair. 05/05/14  Yes Wendie Agreste, MD  ALPRAZolam [name]) 1 MG tablet TAKE 1/2 TO 1 TABLET BY MOUTH THREE TIMES A DAY AS NEEDED FOR ANXIETY AND SLEEP Patient not taking: Reported on 01/23/2017 05/05/14   Wendie Agreste, MD  ondansetron (ZOFRAN ODT) 4 MG  disintegrating tablet Take 1 tablet (4 mg total) by mouth every 8 (eight) hours as needed for nausea or vomiting. Patient not taking: Reported on 01/23/2017 05/05/14   Wendie Agreste, MD    Physical Exam: Vitals:   01/23/17 1945 01/23/17 2215 01/23/17 2230 01/23/17 2315  BP: (!) 104/45 (!) 97/54 (!) 98/49 96/62  Pulse: 61 78 81 60  Resp:    (!) 22  Temp:      TempSrc:      SpO2: 98% 93% 91% 100%      Constitutional: obtunded, appears older than stated-age, no pallor, no diaphoresis.  Eyes: PERTLA, lids and conjunctivae normal ENMT: Mucous membranes are dry. Posterior pharynx clear of any exudate or lesions.   Neck: normal, supple, no masses, no thyromegaly Respiratory: clear to auscultation bilaterally, no wheezing, no crackles. Normal respiratory effort.   Cardiovascular: S1 & S2 heard, regular rate and rhythm. No significant JVD. Abdomen: No distension, no tenderness, no masses palpated. Bowel sounds active.  Musculoskeletal: no clubbing / cyanosis. No joint deformity upper and lower extremities.   Skin: no significant rashes, lesions, ulcers. Warm, dry, well-perfused. Neurologic: PERRL, no facial asymmetry. Moving all extremities spontaneously. Patellar DTR's wnl.   Psychiatric: Obtunded, no intelligible responses.     Labs on Admission: I have personally reviewed following labs and imaging studies  CBC:  Recent Labs Lab 01/23/17 1629 01/23/17 1709  WBC  --  11.4*  NEUTROABS  --  8.2*  HGB 15.0 13.1  HCT 44.0 39.5  MCV  --  93.2  PLT  --  696   Basic Metabolic Panel:  Recent Labs Lab 01/23/17 1551 01/23/17 1629  NA 133* 137  K 3.6 3.5  CL 100* 99*  CO2 23  --   GLUCOSE 140* 141*  BUN 12 14  CREATININE 0.93 0.70  CALCIUM 8.9  --    GFR: CrCl cannot be calculated (Unknown ideal weight.). Liver Function Tests:  Recent Labs Lab 01/23/17 1551  AST 20  ALT 7*  ALKPHOS 53  BILITOT 0.6  PROT 7.1  ALBUMIN 4.0   No results for input(s): LIPASE,  AMYLASE in the last 168 hours.  Recent Labs Lab 01/23/17 1935  AMMONIA 30   Coagulation Profile: No results for input(s): INR, PROTIME in the last 168 hours. Cardiac Enzymes: No results for input(s): CKTOTAL, CKMB, CKMBINDEX, TROPONINI in the last 168 hours. BNP (last 3 results) No results for input(s): PROBNP in the last 8760 hours. HbA1C: No results for input(s): HGBA1C in the last 72 hours. CBG: No results for input(s): GLUCAP in the last 168 hours. Lipid Profile: No results for input(s): CHOL, HDL, LDLCALC, TRIG, CHOLHDL, LDLDIRECT in the last 72 hours. Thyroid Function Tests: No results for input(s): TSH, T4TOTAL, FREET4, T3FREE, THYROIDAB in the last 72 hours. Anemia Panel: No results for input(s): VITAMINB12, FOLATE, FERRITIN, TIBC, IRON, RETICCTPCT in the last 72 hours. Urine analysis:    Component Value Date/Time   COLORURINE YELLOW 01/23/2017 1705   APPEARANCEUR CLOUDY (A) 01/23/2017 1705   LABSPEC 1.018 01/23/2017 1705   PHURINE 8.0 01/23/2017 1705   GLUCOSEU 50 (A) 01/23/2017 1705   HGBUR NEGATIVE  01/23/2017 [place] 01/23/2017 1705   KETONESUR 20 (A) 01/23/2017 1705   PROTEINUR NEGATIVE 01/23/2017 1705   NITRITE NEGATIVE 01/23/2017 1705   LEUKOCYTESUR NEGATIVE 01/23/2017 1705   Sepsis Labs: @LABRCNTIP (procalcitonin:4,lacticidven:4) )No results found for this or any previous visit (from the past 240 hour(s)).   Radiological Exams on Admission: Ct Head Wo Contrast  Result Date: 01/23/2017 CLINICAL DATA:  Dizziness and headache EXAM: CT HEAD WITHOUT CONTRAST TECHNIQUE: Contiguous axial images were obtained from the base of the skull through the vertex without intravenous contrast. COMPARISON:  None. FINDINGS: Brain: No evidence of acute infarction, hemorrhage, hydrocephalus, extra-axial collection or mass lesion/mass effect. Vascular: No hyperdense vessel or unexpected calcification. Skull: Normal. Negative for fracture or focal lesion.  Sinuses/Orbits: Mucosal thickening in the ethmoid sphenoid and maxillary sinuses. No acute orbital abnormality Other: None IMPRESSION: No CT evidence for acute intracranial abnormality. Electronically Signed   By: Donavan Foil M.D.   On: 01/23/2017 17:33    EKG: Independently reviewed. Normal sinus rhythm.   Assessment/Plan  1. Acute agitation   - Pt presented with HA and back pain, was initially not cooperating with interview or exam, and then became acutely agitated, striking and spitting and ED personnel  - Given the presenting complaints, meningitis was considered, but he has been afebrile with supple neck  - NSG has evaluated the patient in ED and felt that surgical site appeared appropriate and had low-suspicion for a surgical complication as explanation for the presentation  - Possibly drug-related, possibly alcohol withdrawal  - He is obtunded on admission and treatment with Ativan, Versed, Haldol, and ketamine  - Plan to observe in SDU, continue supportive care, check additional AMS labs, consider MRI if fails to improve  2. Headache  - Head CT is negative for abnormality  - CSF leak was considered, NSG has evaluated patient, did not feel MRI needed at this time  - No focal deficit identified, no fever or nuchal rigidity  - Plant to continue supportive care   3. Chronic back pain  - Status-post laminectomy on 01/19/17  - Evaluated by NSG in ED; surgical site appears appropriate   - Continue prn analgesia   4. Hyponatremia  - Serum sodium is 133 on admission in setting of apparent hypovolemia  - He was treated with 3 liters IVF in ED  - Repeat chem panel in am    5. Polysubstance abuse  - UDS positive for cocaine, benzodiazepine, and THC  - Pt's mother reports that he drinks alcohol, but unable to quantify, EtOH level <10 on admission  - Plan to monitor with CIWA and prn Ativan  - SW consultation requested    DVT prophylaxis: sq Lovenox  Code Status: Full  Family  Communication: Mother updated at bedside Disposition Plan: Observe in SDU Consults called: NSG Admission status: Observation    Vianne Bulls, MD [facility] Pager [phone]  If 7PM-7AM, please contact night-coverage [email or web address] Password [facility]  01/23/2017, 11:49 PM

## 2017-01-23 NOTE — ED Notes (Signed)
To CT at this time on monitor and 02

## 2017-01-23 NOTE — ED Provider Notes (Signed)
[facility] DEPT Provider Note   CSN: [id number] Arrival date & time: 01/23/17  1400     History   Chief Complaint Chief Complaint  Patient presents with  . Weakness    HPI Zachary Osborn is a 41 y.o. male.  41 yo M with a chief complaint of low back pain. Patient has had chronic issue with this had a laminectomy done on Friday. Was scheduled for an outpatient follow-up at 3:30 today they came to the ED instead.      The history is provided by the patient.  Weakness  Pertinent negatives include no shortness of breath, no chest pain, no vomiting, no confusion and no headaches.  Illness  This is a new problem. The current episode started 2 days ago. The problem occurs constantly. The problem has not changed since onset.Pertinent negatives include no chest pain, no abdominal pain, no headaches and no shortness of breath. The symptoms are aggravated by bending, twisting and walking. Nothing relieves the symptoms. He has tried nothing for the symptoms. The treatment provided no relief.    Past Medical History:  Diagnosis Date  . Anxiety     Patient Active Problem List   Diagnosis Date Noted  . Polysubstance abuse 01/23/2017  . Chronic pain 01/23/2017  . Hyponatremia 01/23/2017  . Agitation 01/23/2017  . Headache 01/23/2017  . Generalized anxiety disorder 11/04/2012    Past Surgical History:  Procedure Laterality Date  . BACK SURGERY         Home Medications    Prior to Admission medications   Medication Sig Start Date End Date Taking? Authorizing Provider  docusate sodium (COLACE) 50 MG capsule Take 50 mg by mouth daily.   Yes [provider]  ibuprofen (ADVIL,MOTRIN) 200 MG tablet Take 400 mg by mouth every 6 (six) hours as needed.   Yes [provider]  oxyCODONE-acetaminophen (PERCOCET) 7.5-325 MG tablet Take 1 tablet by mouth every 4 (four) hours as needed. 01/19/17  Yes [provider]  polyethylene glycol powder  (GLYCOLAX/MIRALAX) powder Take 1 Container by mouth once.   Yes [provider]  tiZANidine (ZANAFLEX) 4 MG tablet Take 4 mg by mouth every 8 (eight) hours as needed. 01/04/17  Yes [provider]  valACYclovir (VALTREX) 1000 MG tablet Take 1 tablet (1,000 mg total) by mouth daily. For 5 days with any sign or symptoms of recurrent flair. 05/05/14  Yes Wendie Agreste, MD  ALPRAZolam [name]) 1 MG tablet TAKE 1/2 TO 1 TABLET BY MOUTH THREE TIMES A DAY AS NEEDED FOR ANXIETY AND SLEEP Patient not taking: Reported on 01/23/2017 05/05/14   Wendie Agreste, MD  ondansetron (ZOFRAN ODT) 4 MG disintegrating tablet Take 1 tablet (4 mg total) by mouth every 8 (eight) hours as needed for nausea or vomiting. Patient not taking: Reported on 01/23/2017 05/05/14   Wendie Agreste, MD    Family History Family History  Problem Relation Age of Onset  . Obesity Mother     Social History Social History  Substance Use Topics  . Smoking status: Light Tobacco Smoker    Years: 20.00    Types: Cigarettes  . Smokeless tobacco: Never Used  . Alcohol use No     Allergies   Patient has no known allergies.   Review of Systems Review of Systems  Constitutional: Negative for chills and fever.  HENT: Negative for congestion and facial swelling.   Eyes: Negative for discharge and visual disturbance.  Respiratory: Negative for shortness of breath.  Cardiovascular: Negative for chest pain and palpitations.  Gastrointestinal: Negative for abdominal pain, diarrhea and vomiting.  Musculoskeletal: Positive for back pain. Negative for arthralgias and myalgias.  Skin: Negative for color change and rash.  Neurological: Positive for weakness. Negative for tremors, syncope and headaches.  Psychiatric/Behavioral: Negative for confusion and dysphoric mood.     Physical Exam Updated Vital Signs BP (!) 108/53 (BP Location: Left Arm)   Pulse 62   Temp 98.6 F (37 C) (Oral)   Resp 14   Ht 6' (1.829 m)    Wt 79.4 kg (175 lb)   SpO2 98%   BMI 23.73 kg/m   Physical Exam  Constitutional: He is oriented to person, place, and time. He appears well-developed and well-nourished.  HENT:  Head: Normocephalic and atraumatic.  Eyes: Pupils are equal, round, and reactive to light. EOM are normal.  Neck: Normal range of motion. Neck supple. No JVD present.  Cardiovascular: Normal rate and regular rhythm.  Exam reveals no gallop and no friction rub.   No murmur heard. Pulmonary/Chest: No respiratory distress. He has no wheezes.  Abdominal: He exhibits no distension. There is no rebound and no guarding.  Musculoskeletal: Normal range of motion.  Neurological: He is alert and oriented to person, place, and time.  Skin: No rash noted. No pallor.  Psychiatric: He has a normal mood and affect. His behavior is normal.  Nursing note and vitals reviewed.    ED Treatments / Results  Labs (all labs ordered are listed, but only abnormal results are displayed) Labs Reviewed  COMPREHENSIVE METABOLIC PANEL - Abnormal; Notable for the following:       Result Value   Sodium 133 (*)    Chloride 100 (*)    Glucose, Bld 140 (*)    ALT 7 (*)    All other components within normal limits  ETHANOL - Abnormal; Notable for the following:    Alcohol, Ethyl (B) <10 (*)    All other components within normal limits  URINALYSIS, ROUTINE W REFLEX MICROSCOPIC - Abnormal; Notable for the following:    APPearance CLOUDY (*)    Glucose, UA 50 (*)    Ketones, ur 20 (*)    All other components within normal limits  RAPID URINE DRUG SCREEN, HOSP PERFORMED - Abnormal; Notable for the following:    Cocaine POSITIVE (*)    Benzodiazepines POSITIVE (*)    Tetrahydrocannabinol POSITIVE (*)    All other components within normal limits  CBC WITH DIFFERENTIAL/PLATELET - Abnormal; Notable for the following:    WBC 11.4 (*)    Neutro Abs 8.2 (*)    All other components within normal limits  I-STAT CHEM 8, ED - Abnormal;  Notable for the following:    Chloride 99 (*)    Glucose, Bld 141 (*)    Calcium, Ion 1.07 (*)    All other components within normal limits  I-STAT CG4 LACTIC ACID, ED - Abnormal; Notable for the following:    Lactic Acid, Venous 3.67 (*)    All other components within normal limits  I-STAT VENOUS BLOOD GAS, ED - Abnormal; Notable for the following:    pH, Ven 7.497 (*)    pCO2, Ven 32.6 (*)    pO2, Ven 31.0 (*)    Acid-Base Excess 3.0 (*)    All other components within normal limits  CBG MONITORING, ED - Abnormal; Notable for the following:    Glucose-Capillary 103 (*)    All other components within normal limits  URINE CULTURE  MRSA PCR SCREENING  AMMONIA  TSH  CBC WITH DIFFERENTIAL/PLATELET  BLOOD GAS, VENOUS  HIV ANTIBODY (ROUTINE TESTING)  PROCALCITONIN  VITAMIN B1  CBG MONITORING, ED  I-STAT CG4 LACTIC ACID, ED    EKG  EKG Interpretation  Date/Time:  Tuesday January 23 2017 14:28:34 EDT Ventricular Rate:  74 PR Interval:  150 QRS Duration: 94 QT Interval:  382 QTC Calculation: 424 R Axis:   104 Text Interpretation:  Normal sinus rhythm Rightward axis Borderline ECG No old tracing to compare Confirmed by Deno Etienne [phone]) on 01/23/2017 2:53:53 PM       Radiology Ct Head Wo Contrast  Result Date: 01/23/2017 CLINICAL DATA:  Dizziness and headache EXAM: CT HEAD WITHOUT CONTRAST TECHNIQUE: Contiguous axial images were obtained from the base of the skull through the vertex without intravenous contrast. COMPARISON:  None. FINDINGS: Brain: No evidence of acute infarction, hemorrhage, hydrocephalus, extra-axial collection or mass lesion/mass effect. Vascular: No hyperdense vessel or unexpected calcification. Skull: Normal. Negative for fracture or focal lesion. Sinuses/Orbits: Mucosal thickening in the ethmoid sphenoid and maxillary sinuses. No acute orbital abnormality Other: None IMPRESSION: No CT evidence for acute intracranial abnormality. Electronically Signed    By: Donavan Foil M.D.   On: 01/23/2017 17:33    Procedures Procedures (including critical care time)  Medications Ordered in ED Medications  haloperidol lactate (HALDOL) injection 5 mg ( Intravenous Canceled Entry 01/23/17 1630)  haloperidol lactate (HALDOL) 5 MG/ML injection ( Intravenous Canceled Entry 01/23/17 1615)  docusate sodium (COLACE) capsule 50 mg (not administered)  oxyCODONE-acetaminophen (PERCOCET) 7.5-325 MG per tablet 1 tablet (not administered)  tiZANidine (ZANAFLEX) tablet 4 mg (not administered)  insulin aspart (novoLOG) injection 0-9 Units (0 Units Subcutaneous Not Given 01/24/17 0414)  sodium chloride flush (NS) 0.9 % injection 3 mL ( Intravenous Canceled Entry 01/24/17 0000)  acetaminophen (TYLENOL) tablet 650 mg (not administered)    Or  acetaminophen (TYLENOL) suppository 650 mg (not administered)  ondansetron (ZOFRAN) tablet 4 mg (not administered)    Or  ondansetron (ZOFRAN) injection 4 mg (not administered)  senna-docusate (Senokot-S) tablet 1 tablet (not administered)  bisacodyl (DULCOLAX) EC tablet 5 mg (not administered)  ketorolac (TORADOL) 30 MG/ML injection 30 mg (not administered)  0.9 %  sodium chloride infusion ( Intravenous New Bag/Given 01/24/17 0517)  LORazepam (ATIVAN) injection 2-3 mg (not administered)  folic acid injection 1 mg (not administered)  thiamine (VITAMIN B-1) tablet 100 mg (not administered)  multivitamin with minerals tablet 1 tablet (not administered)  nicotine (NICODERM CQ - dosed in mg/24 hours) patch 21 mg (not administered)  diphenhydrAMINE (BENADRYL) injection 25 mg (25 mg Intravenous Given 01/23/17 1558)  prochlorperazine (COMPAZINE) injection 10 mg (10 mg Intravenous Given 01/23/17 1559)  midazolam (VERSED) 5 MG/5ML injection 6 mg (6 mg Intravenous Given 01/23/17 1614)  sodium chloride 0.9 % bolus 1,000 mL (0 mLs Intravenous Stopped 01/23/17 2150)  ketamine 100 mg in normal saline 10 mL (10mg /mL) syringe (100 mg Intravenous  Given 01/23/17 1644)  sodium chloride 0.9 % bolus 2,000 mL (0 mLs Intravenous Stopped 01/23/17 2356)  LORazepam (ATIVAN) injection 2 mg (2 mg Intravenous Given 01/23/17 2150)  LORazepam (ATIVAN) injection 1 mg (1 mg Intravenous Given 01/23/17 2304)     Initial Impression / Assessment and Plan / ED Course  I have reviewed the triage vital signs and the nursing notes.  Pertinent labs & imaging results that were available during my care of the patient were reviewed by me and considered in my  medical decision making (see chart for details).     41 yo M with low back pain.  Didn't feel he could make it to his appointment.   Seen immediately on arrival to the ED by the neurosurgical PA. I feel this is unlikely to be a side effect of the spinal surgery.  On my initial exam the patient became agitated and started being aggressive towards nursing staff. Verbal de-escalation was unable to help the patient would not interact with any of the faculty. He is actively moving all 4 extremities without difficulty. No noted lower extremity weakness.  Given sedation.  Requiring multiple doses.    CRITICAL CARE Performed by: Cecilio Asper   Total critical care time: 35 minutes  Critical care time was exclusive of separately billable procedures and treating other patients.  Critical care was necessary to treat or prevent imminent or life-threatening deterioration.  Critical care was time spent personally by me on the following activities: development of treatment plan with patient and/or surrogate as well as nursing, discussions with consultants, evaluation of patient's response to treatment, examination of patient, obtaining history from patient or surrogate, ordering and performing treatments and interventions, ordering and review of laboratory studies, ordering and review of radiographic studies, pulse oximetry and re-evaluation of patient's condition.  The patients results and plan were reviewed  and discussed.   Any x-rays performed were independently reviewed by myself.   Differential diagnosis were considered with the presenting HPI.  Medications  haloperidol lactate (HALDOL) injection 5 mg ( Intravenous Canceled Entry 01/23/17 1630)  haloperidol lactate (HALDOL) 5 MG/ML injection ( Intravenous Canceled Entry 01/23/17 1615)  docusate sodium (COLACE) capsule 50 mg (not administered)  oxyCODONE-acetaminophen (PERCOCET) 7.5-325 MG per tablet 1 tablet (not administered)  tiZANidine (ZANAFLEX) tablet 4 mg (not administered)  insulin aspart (novoLOG) injection 0-9 Units (0 Units Subcutaneous Not Given 01/24/17 0414)  sodium chloride flush (NS) 0.9 % injection 3 mL ( Intravenous Canceled Entry 01/24/17 0000)  acetaminophen (TYLENOL) tablet 650 mg (not administered)    Or  acetaminophen (TYLENOL) suppository 650 mg (not administered)  ondansetron (ZOFRAN) tablet 4 mg (not administered)    Or  ondansetron (ZOFRAN) injection 4 mg (not administered)  senna-docusate (Senokot-S) tablet 1 tablet (not administered)  bisacodyl (DULCOLAX) EC tablet 5 mg (not administered)  ketorolac (TORADOL) 30 MG/ML injection 30 mg (not administered)  0.9 %  sodium chloride infusion ( Intravenous New Bag/Given 01/24/17 0517)  LORazepam (ATIVAN) injection 2-3 mg (not administered)  folic acid injection 1 mg (not administered)  thiamine (VITAMIN B-1) tablet 100 mg (not administered)  multivitamin with minerals tablet 1 tablet (not administered)  nicotine (NICODERM CQ - dosed in mg/24 hours) patch 21 mg (not administered)  diphenhydrAMINE (BENADRYL) injection 25 mg (25 mg Intravenous Given 01/23/17 1558)  prochlorperazine (COMPAZINE) injection 10 mg (10 mg Intravenous Given 01/23/17 1559)  midazolam (VERSED) 5 MG/5ML injection 6 mg (6 mg Intravenous Given 01/23/17 1614)  sodium chloride 0.9 % bolus 1,000 mL (0 mLs Intravenous Stopped 01/23/17 2150)  ketamine 100 mg in normal saline 10 mL (10mg /mL) syringe (100 mg  Intravenous Given 01/23/17 1644)  sodium chloride 0.9 % bolus 2,000 mL (0 mLs Intravenous Stopped 01/23/17 2356)  LORazepam (ATIVAN) injection 2 mg (2 mg Intravenous Given 01/23/17 2150)  LORazepam (ATIVAN) injection 1 mg (1 mg Intravenous Given 01/23/17 2304)    Vitals:   01/24/17 0415 01/24/17 0416 01/24/17 0450 01/24/17 0451  BP: 113/64  (!) 108/53   Pulse: 64  62  Resp: 18  14   Temp:    98.6 F (37 C)  TempSrc:    Oral  SpO2: 94%  98%   Weight:  79.4 kg (175 lb)    Height:  6' (1.829 m)      Final diagnoses:  Disorientation  Chronic right-sided low back pain with right-sided sciatica        Final Clinical Impressions(s) / ED Diagnoses   Final diagnoses:  Disorientation  Chronic right-sided low back pain with right-sided sciatica    New Prescriptions Current Discharge Medication List       Deno Etienne, DO 01/24/17 0708

## 2017-01-23 NOTE — ED Notes (Signed)
Pt on 02 via [facility] at 3LPM 

## 2017-01-24 ENCOUNTER — Encounter (HOSPITAL_COMMUNITY): Payer: Self-pay

## 2017-01-24 DIAGNOSIS — R451 Restlessness and agitation: Secondary | ICD-10-CM

## 2017-01-24 LAB — GLUCOSE, CAPILLARY
Glucose-Capillary: 99 mg/dL (ref 65–99)
Glucose-Capillary: 86 mg/dL (ref 65–99)

## 2017-01-24 LAB — PROCALCITONIN: Procalcitonin: <0.1 ng/mL

## 2017-01-24 LAB — CBG MONITORING, ED
GLUCOSE-CAPILLARY: 93 mg/dL (ref 65–99)
Glucose-Capillary: 103 mg/dL — ABNORMAL HIGH (ref 65–99)

## 2017-01-24 LAB — TSH: TSH: 0.461 u[IU]/mL (ref 0.350–4.500)

## 2017-01-24 LAB — HIV ANTIBODY (ROUTINE TESTING W REFLEX): HIV SCREEN 4TH GENERATION: NONREACTIVE

## 2017-01-24 LAB — MRSA PCR SCREENING: MRSA by PCR: NEGATIVE

## 2017-01-24 MED ORDER — LORAZEPAM 0.5 MG PO TABS: 0.5000 mg | ORAL_TABLET | Freq: Four times a day (QID) | ORAL | Status: DC | PRN

## 2017-01-24 MED ORDER — ACETAMINOPHEN 325 MG PO TABS: 650.0000 mg | ORAL_TABLET | Freq: Four times a day (QID) | ORAL | Status: DC | PRN

## 2017-01-24 MED ORDER — LORAZEPAM 0.5 MG PO TABS: 0.5000 mg | ORAL_TABLET | Freq: Four times a day (QID) | ORAL | 0 refills | Status: DC | PRN

## 2017-01-24 MED ORDER — NICOTINE 21 MG/24HR TD PT24
21.0000 mg | MEDICATED_PATCH | Freq: Every day | TRANSDERMAL | Status: DC
  Administered 2017-01-24: 21 mg via TRANSDERMAL
  Filled 2017-01-24: qty 1
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
## 2017-01-24 NOTE — ED Notes (Signed)
Report attempted 

## 2017-01-24 NOTE — Progress Notes (Signed)
Resident discharged home transported by mother. Reviewed all discharge instructions and removed IV from right hand with bandage placed.

## 2017-01-24 NOTE — Discharge Instructions (Signed)
Delirium Delirium is a state of mental confusion. It comes on quickly and causes significant changes in a person's thinking and behavior. People with delirium usually have trouble paying attention to what is going on or knowing where they are. They may become very withdrawn or very emotional and unable to sit still. They may even see or feel things that are not there (hallucinations). Delirium is a sign of a serious underlying medical condition. What are the causes? Delirium occurs when something suddenly affects the signals that the brain sends out. Brain signals can be affected by anything that puts severe stress on the body and brain and causes brain chemicals to be out of balance. The most common causes of delirium include:  Infections. These may be bacterial, viral, fungal, or protozoal.  Medicines. These include many over-the-counter and prescription medicines.  Recreational drugs.  Substance withdrawal. This occurs with sudden discontinuation of alcohol, certain medicines, or recreational drugs.  Surgery.  Sudden vascular events, such as stroke, brain hemorrhage, and severe migraine.  Other brain disorders, such as tumors, seizures, and physical head trauma.  Metabolic disorders, such as kidney or liver failure.  Low blood oxygen (anoxia). This may occur with lung disease, cardiac arrest, or carbon monoxide poisoning.  Hormone imbalances (endocrinopathies), such as an overactive thyroid (hyperthyroidism) or underactive thyroid (hypothyroidism).  Vitamin deficiencies.  What increases the risk? This condition is more likely to develop in:  Children.  Older people.  People who live alone.  People who have vision loss or hearing loss.  People who have existing brain disease, such as dementia.  People who have long-lasting (chronic) medical conditions, such as heart disease.  People who are hospitalized for long periods of time.  What are the signs or symptoms? Delirium  starts with a sudden change in a person's thinking or behavior. Symptoms come and go (fluctuate) over time, and they are often worse at the end of the day. Symptoms include:  Not being able to stay awake (drowsiness) or pay attention.  Being confused about places, time, and people.  Forgetfulness.  Having extreme energy levels. These may be low or high.  Changes in sleep patterns.  Extreme mood swings, such as anger or anxiety.  Focusing on things or ideas that are not important.  Rambling and senseless talking.  Difficulty speaking, understanding speech, or both.  Hallucinations.  Tremor or unsteady gait.  How is this diagnosed? People with delirium may not realize that they have the condition. Often, a family member or health care provider is the first person to notice the changes. The health care provider will obtain a detailed history of current symptoms, medical issues, medicines, and recreational drug use. The health care provider will perform a mental status examination by:  Asking questions to check for confusion.  Watching for abnormal behavior.  The health care provider may perform a physical exam and order lab tests or additional studies to determine the cause of the delirium. How is this treated? Treatment of delirium depends on the cause and severity. Delirium usually goes away within days or weeks of treating the underlying cause. In the meantime, the person should not be left alone because he or she may accidentally cause self-harm. Treatment includes supportive care, such as:  Increased light during the day and decreased light at night.  Low noise level.  Uninterrupted sleep.  A regular daily schedule.  Clocks and calendars to help with orientation.  Familiar objects, including the person's pictures and clothing.  Frequent visits   from familiar family and friends.  Healthy diet.  Exercise.  In more severe cases of delirium, medicine may be  prescribed to help the person to keep calm and think more clearly. Follow these instructions at home:  Any supportive care should be continued as told by the health care provider.  All medicines should be used as told by the health care provider. This is important.  The health care provider should be consulted before over-the-counter medicines, herbs, or supplements are used.  All follow-up visits should be kept as told by the health care provider. This is important.  Alcohol and recreational drugs should be avoided as told by the health care provider. Contact a health care provider if:  Symptoms do not get better or they become worse.  New symptoms of delirium develop.  Caring for the person at home does not seem safe.  Eating, drinking, or communicating stops.  There are side effects of medicines, such as changes in sleep patterns, dizziness, weight gain, restlessness, movement changes, or tremors. Get help right away if:  Serious thoughts occur about self-harm or about hurting others.  There are serious side effects of medicine, such as: ? Swelling of the face, lips, tongue, or throat. ? Fever, confusion, muscle spasms, or seizures. This information is not intended to replace advice given to you by your health care provider. Make sure you discuss any questions you have with your health care provider. Document Released: 01/10/2012 Document Revised: 09/23/2015 Document Reviewed: 06/10/2014 Elsevier Interactive Patient Education  2018 [employer]  

## 2017-01-25 LAB — URINE CULTURE: CULTURE: NO GROWTH

## 2017-01-26 LAB — VITAMIN B1: VITAMIN B1 (THIAMINE): 136.7 nmol/L (ref 66.5–200.0)

## 2018-07-01 IMAGING — CT CT HEAD W/O CM
4 series · 15 of 47 positions shown, 17 images · non-contrast
Comparison: None.

CLINICAL DATA: Dizziness and headache

EXAM:
CT HEAD WITHOUT CONTRAST
TECHNIQUE: Contiguous axial images were obtained from the base of the skull
through the vertex without intravenous contrast.

[Series 3: head wo · axial · 0.46mm/px · z∈[+1247,+1382]mm · 7 of 37 slices shown, 9 images]
[im 5/37  brain]
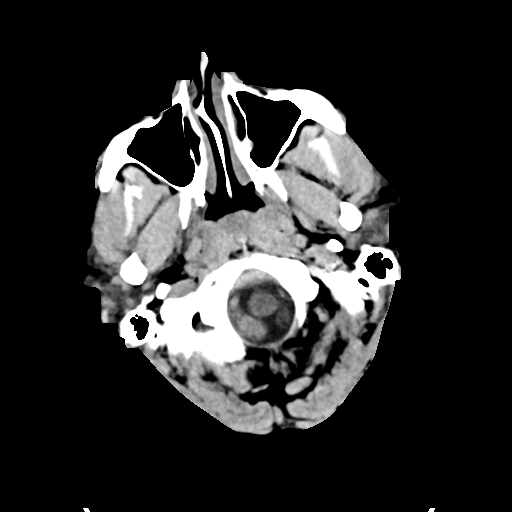
[im 5/37  bone]
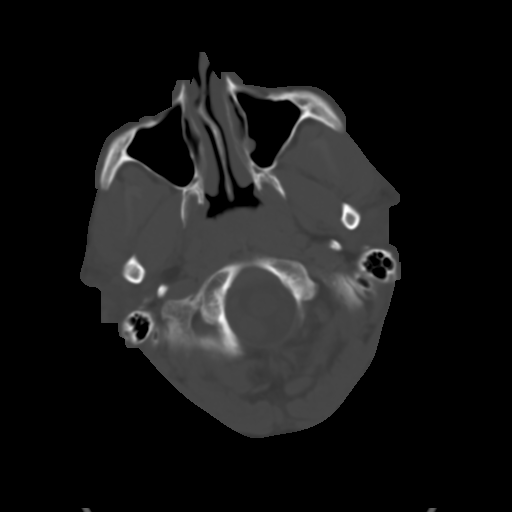
[im 10/37  brain]
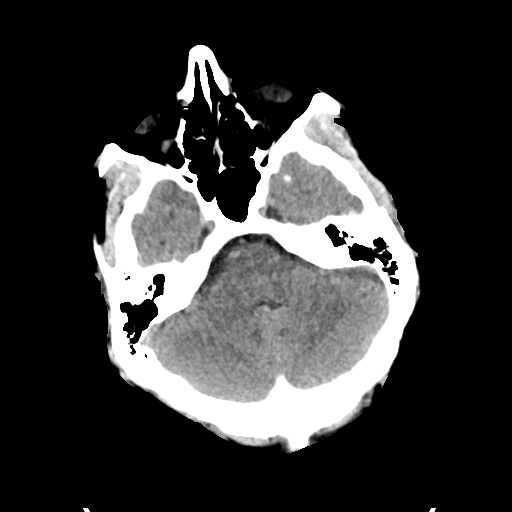
[im 14/37  brain]
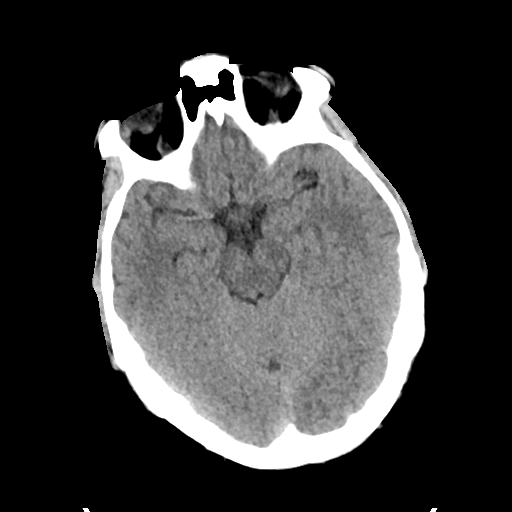
[im 19/37  brain]
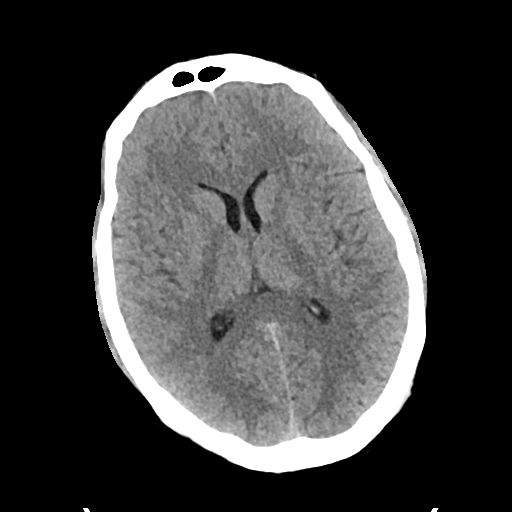
[im 23/37  brain]
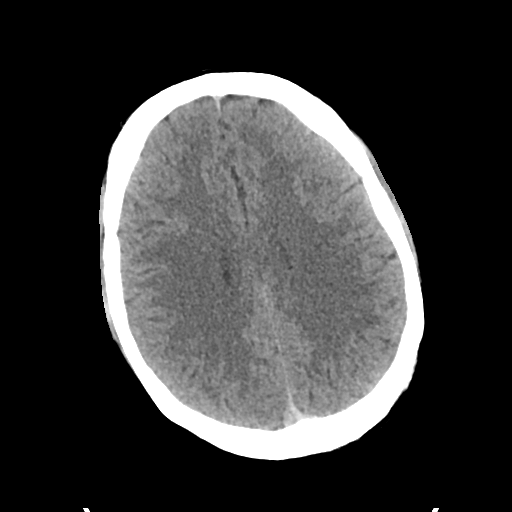
[im 23/37  bone]
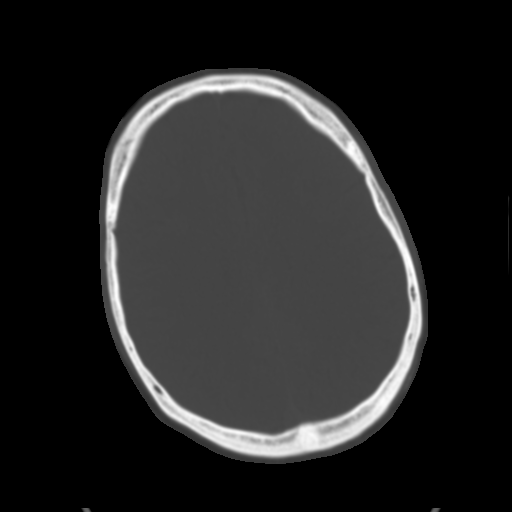
[im 28/37  brain]
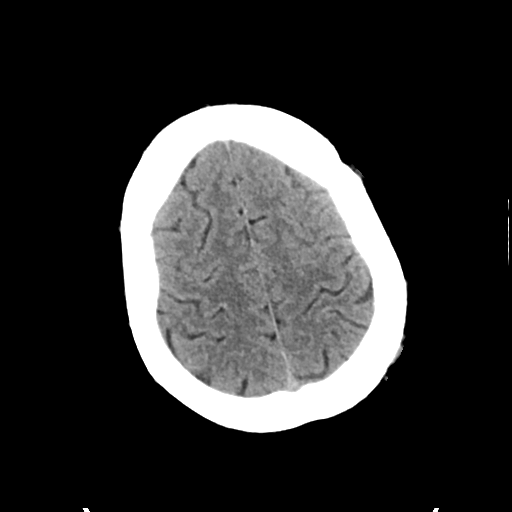
[im 32/37  brain]
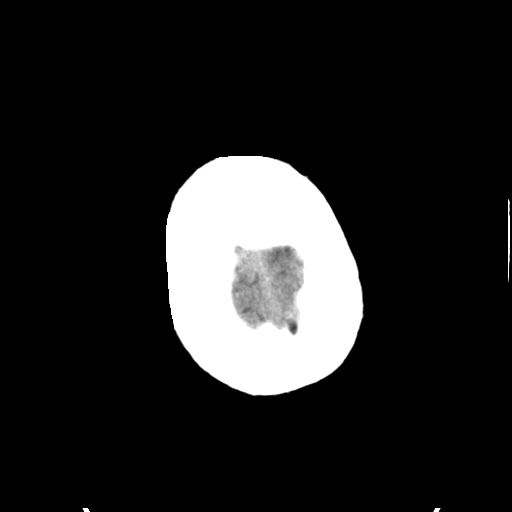

[Series 4: head bone · axial · 0.46mm/px · z∈[+1245,+1263]mm · 2 of 92 slices shown]
[im 10/92  bone]
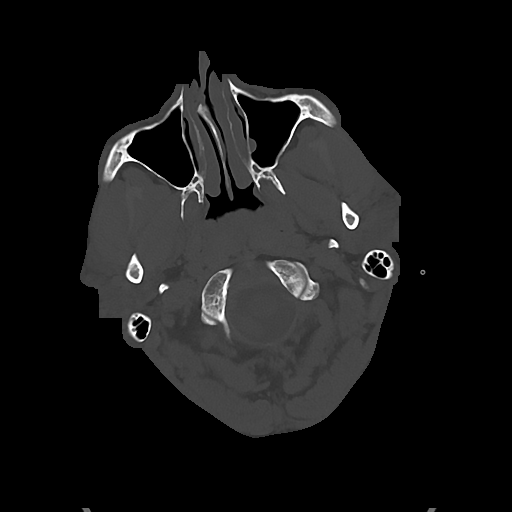
[im 19/92  bone]
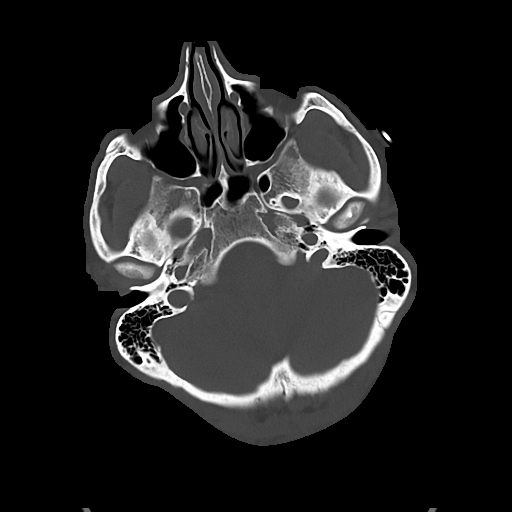

[Series 5: cor soft · coronal · 0.36mm/px · 3 of 73 slices shown]
[im 25/73  brain]
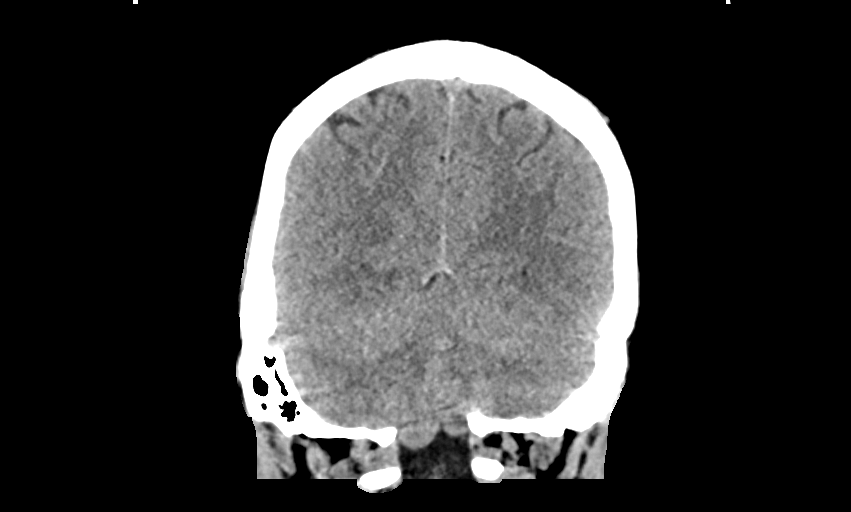
[im 33/73  brain]
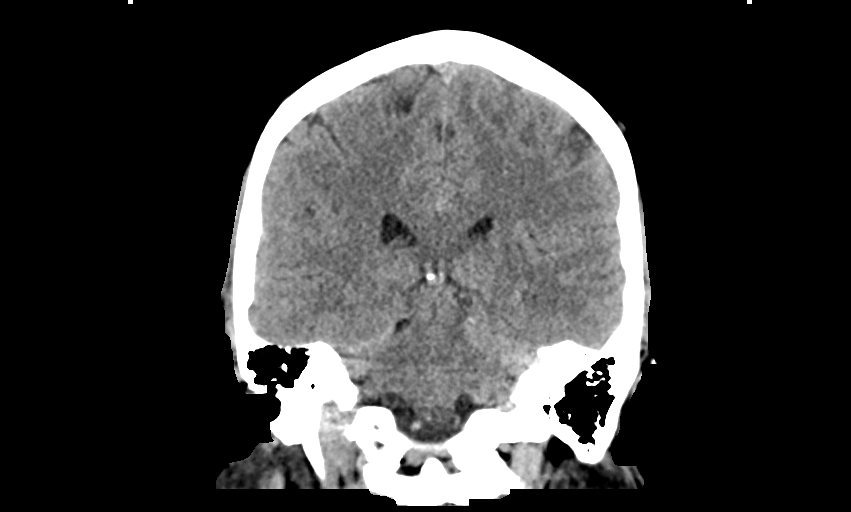
[im 41/73  brain]
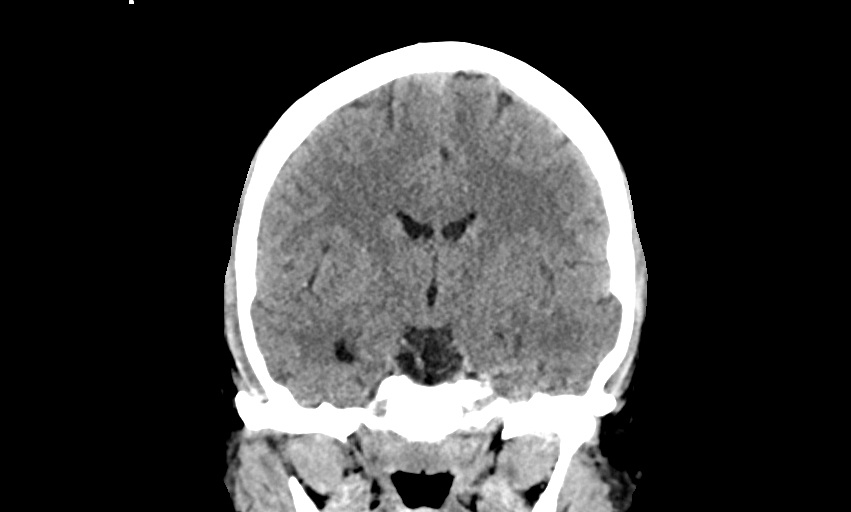

[Series 6: sag soft · sagittal · 0.36mm/px · 3 of 64 slices shown]
[im 22/64  brain]
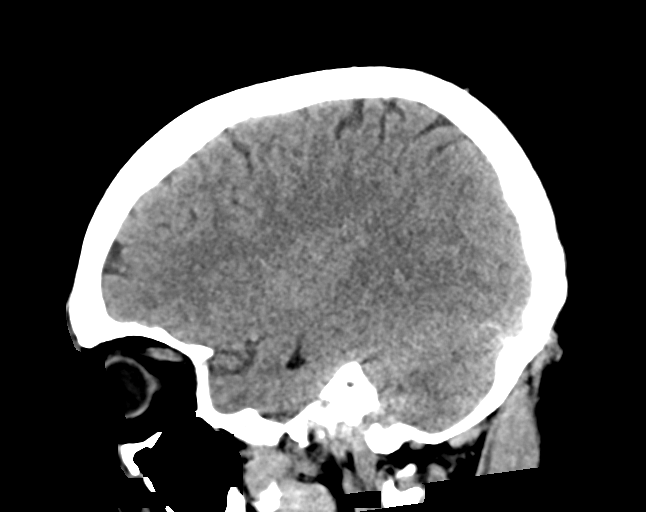
[im 32/64  brain]
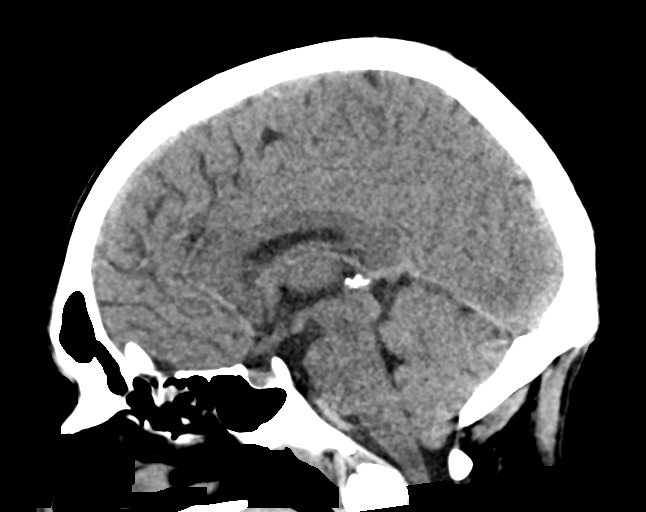
[im 43/64  brain]
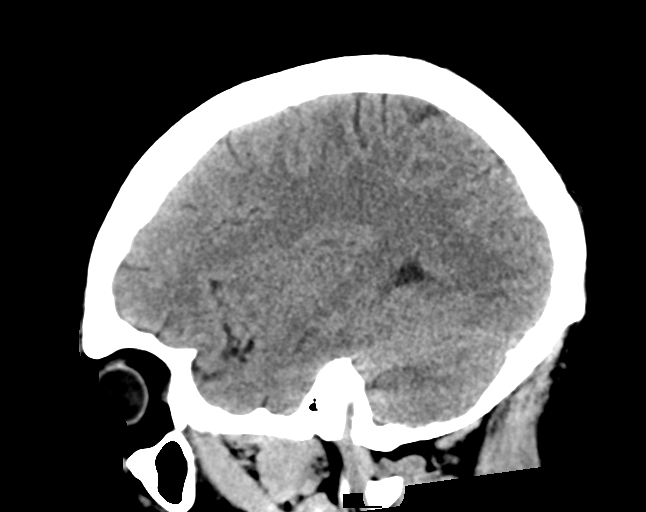

[15 of 47 positions shown; findings below may reference images not displayed]

FINDINGS: Brain: No evidence of acute infarction, hemorrhage, hydrocephalus,
extra-axial collection or mass lesion/mass effect.

Vascular: No hyperdense vessel or unexpected calcification.

Skull: Normal. Negative for fracture or focal lesion.

Sinuses/Orbits: Mucosal thickening in the ethmoid sphenoid and
maxillary sinuses. No acute orbital abnormality

Other: None
IMPRESSION: No CT evidence for acute intracranial abnormality.

## 2018-11-13 ENCOUNTER — Other Ambulatory Visit: Payer: Self-pay

## 2018-11-13 DIAGNOSIS — Z20822 Contact with and (suspected) exposure to covid-19: Secondary | ICD-10-CM

## 2018-11-17 LAB — NOVEL CORONAVIRUS, NAA: SARS-CoV-2, NAA: NOT DETECTED

## 2018-11-21 ENCOUNTER — Ambulatory Visit (INDEPENDENT_AMBULATORY_CARE_PROVIDER_SITE_OTHER): Payer: Managed Care, Other (non HMO) | Admitting: Physician Assistant

## 2018-11-21 ENCOUNTER — Encounter: Payer: Self-pay | Admitting: Physician Assistant

## 2018-11-21 ENCOUNTER — Other Ambulatory Visit: Payer: Self-pay

## 2018-11-21 ENCOUNTER — Other Ambulatory Visit (HOSPITAL_COMMUNITY)
Admission: RE | Admit: 2018-11-21 | Discharge: 2018-11-21 | Disposition: A | Discharge status: Home / Self Care | Payer: Managed Care, Other (non HMO) | Source: Ambulatory Visit | Attending: Physician Assistant | Admitting: Physician Assistant

## 2018-11-21 VITALS — BP 120/80 | HR 86 | Temp 99.1°F | Resp 16 | Ht 71.0 in | Wt 185.0 lb

## 2018-11-21 DIAGNOSIS — Z113 Encounter for screening for infections with a predominantly sexual mode of transmission: Secondary | ICD-10-CM | POA: Insufficient documentation

## 2018-11-21 DIAGNOSIS — S9781XA Crushing injury of right foot, initial encounter: Secondary | ICD-10-CM

## 2018-11-21 DIAGNOSIS — F1911 Other psychoactive substance abuse, in remission: Secondary | ICD-10-CM

## 2018-11-21 DIAGNOSIS — K648 Other hemorrhoids: Secondary | ICD-10-CM

## 2018-11-21 DIAGNOSIS — F17200 Nicotine dependence, unspecified, uncomplicated: Secondary | ICD-10-CM

## 2018-11-21 DIAGNOSIS — B977 Papillomavirus as the cause of diseases classified elsewhere: Secondary | ICD-10-CM | POA: Diagnosis not present

## 2018-11-21 DIAGNOSIS — B009 Herpesviral infection, unspecified: Secondary | ICD-10-CM

## 2018-11-21 DIAGNOSIS — F411 Generalized anxiety disorder: Secondary | ICD-10-CM | POA: Diagnosis not present

## 2018-11-21 DIAGNOSIS — K644 Residual hemorrhoidal skin tags: Secondary | ICD-10-CM

## 2018-11-21 MED ORDER — HYDROXYZINE HCL 10 MG PO TABS: 10.0000 mg | ORAL_TABLET | Freq: Three times a day (TID) | ORAL | 1 refills | Status: DC | PRN

## 2018-11-21 MED ORDER — HYDROCORTISONE ACETATE 25 MG RE SUPP: 25.0000 mg | Freq: Two times a day (BID) | RECTAL | 0 refills | Status: DC
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
## 2018-11-21 NOTE — Patient Instructions (Signed)
Please go to the lab today for blood work.  I will call you with your results. We will alter treatment regimen(s) if indicated by your results.   I encourage you to increase hydration and the amount of fiber in your diet.  Start a daily probiotic (Align, Culturelle, Digestive Advantage, etc.). If no bowel movement within 24 hours, take 2 Tbs of Milk of Magnesia in a 4 oz glass of warmed prune juice every 2-3 days to help promote bowel movement. If no results within 24 hours, then repeat above regimen, adding a Dulcolax stool softener to regimen. If this does not promote a bowel movement, please call the office.  Use the rectal suppository as directed. If not improving, will want to set you up with a specialist.   Please go to the [facility] office for x-ray. We will call you with your results and alter treatment accordingly.   [address]  [place], [place] [address]  Take the Hydroxyzine as directed if needed for acute anxiety.  Follow-up with [place] in 2-3 weeks for follow-up and your complete physical.  It was very nice meeting you today. Welcome to [facility]!

## 2018-11-22 LAB — URINE CYTOLOGY ANCILLARY ONLY
Chlamydia: NEGATIVE
Neisseria Gonorrhea: NEGATIVE
Trichomonas: NEGATIVE

## 2018-11-23 LAB — HSV(HERPES SMPLX)ABS-I+II(IGG+IGM)-BLD
HSV 1 Glycoprotein G Ab, IgG: <0.91 index (ref 0.00–0.90)
HSV 2 IgG, Type Spec: 14.2 index — ABNORMAL HIGH (ref 0.00–0.90)
HSVI/II Comb IgM: 1.31 Ratio — ABNORMAL HIGH (ref 0.00–0.90)

## 2018-11-25 LAB — ACUTE HEP PANEL AND HEP B SURFACE AB
HEPATITIS C ANTIBODY REFILL$(REFL): NONREACTIVE
Hep A IgM: NONREACTIVE
Hep B C IgM: NONREACTIVE
Hepatitis B Surface Ag: NONREACTIVE
SIGNAL TO CUT-OFF: 0.01 (ref <1.00)

## 2018-11-25 LAB — HIV ANTIBODY (ROUTINE TESTING W REFLEX): HIV 1&2 Ab, 4th Generation: NONREACTIVE

## 2018-11-25 LAB — RPR: RPR Ser Ql: NONREACTIVE

## 2018-11-25 LAB — REFLEX TIQ

## 2020-08-18 ENCOUNTER — Telehealth: Payer: Self-pay

## 2020-08-18 NOTE — Telephone Encounter (Signed)
Unable to LVM advising to do TOC.

## 2020-09-02 ENCOUNTER — Other Ambulatory Visit: Payer: Self-pay

## 2020-09-02 ENCOUNTER — Emergency Department (HOSPITAL_BASED_OUTPATIENT_CLINIC_OR_DEPARTMENT_OTHER): Payer: Managed Care, Other (non HMO)

## 2020-09-02 ENCOUNTER — Encounter (HOSPITAL_BASED_OUTPATIENT_CLINIC_OR_DEPARTMENT_OTHER): Payer: Self-pay | Admitting: *Deleted

## 2020-09-02 ENCOUNTER — Emergency Department (HOSPITAL_BASED_OUTPATIENT_CLINIC_OR_DEPARTMENT_OTHER)
Admission: EM | Admit: 2020-09-02 | Discharge: 2020-09-02 | Disposition: A | Discharge status: Home / Self Care | Payer: Managed Care, Other (non HMO) | Attending: Emergency Medicine | Admitting: Emergency Medicine

## 2020-09-02 DIAGNOSIS — F17210 Nicotine dependence, cigarettes, uncomplicated: Secondary | ICD-10-CM | POA: Diagnosis not present

## 2020-09-02 DIAGNOSIS — R1030 Lower abdominal pain, unspecified: Secondary | ICD-10-CM | POA: Diagnosis present

## 2020-09-02 DIAGNOSIS — R2242 Localized swelling, mass and lump, left lower limb: Secondary | ICD-10-CM | POA: Diagnosis not present

## 2020-09-02 DIAGNOSIS — K4090 Unilateral inguinal hernia, without obstruction or gangrene, not specified as recurrent: Secondary | ICD-10-CM

## 2020-09-02 DIAGNOSIS — R1909 Other intra-abdominal and pelvic swelling, mass and lump: Secondary | ICD-10-CM

## 2020-09-02 LAB — CBC WITH DIFFERENTIAL/PLATELET
Abs Immature Granulocytes: 0.02 10*3/uL (ref 0.00–0.07)
Basophils Absolute: 0.1 10*3/uL (ref 0.0–0.1)
Basophils Relative: 1 %
Eosinophils Absolute: 0 10*3/uL (ref 0.0–0.5)
Eosinophils Relative: 0 %
HCT: 46 % (ref 39.0–52.0)
Hemoglobin: 15.4 g/dL (ref 13.0–17.0)
Immature Granulocytes: 0 %
Lymphocytes Relative: 45 %
Lymphs Abs: 4.7 10*3/uL — ABNORMAL HIGH (ref 0.7–4.0)
MCH: 30.5 pg (ref 26.0–34.0)
MCHC: 33.5 g/dL (ref 30.0–36.0)
MCV: 91.1 fL (ref 80.0–100.0)
Monocytes Absolute: 0.5 10*3/uL (ref 0.1–1.0)
Monocytes Relative: 4 %
Neutro Abs: 5.2 10*3/uL (ref 1.7–7.7)
Neutrophils Relative %: 50 %
Platelets: 303 10*3/uL (ref 150–400)
RBC: 5.05 MIL/uL (ref 4.22–5.81)
RDW: 13.2 % (ref 11.5–15.5)
WBC: 10.4 10*3/uL (ref 4.0–10.5)
nRBC: 0 % (ref 0.0–0.2)

## 2020-09-02 LAB — COMPREHENSIVE METABOLIC PANEL
ALT: <5 U/L (ref 0–44)
AST: 13 U/L — ABNORMAL LOW (ref 15–41)
Albumin: 3.8 g/dL (ref 3.5–5.0)
Alkaline Phosphatase: 65 U/L (ref 38–126)
Anion gap: 10 (ref 5–15)
BUN: 12 mg/dL (ref 6–20)
CO2: 17 mmol/L — ABNORMAL LOW (ref 22–32)
Calcium: 8.4 mg/dL — ABNORMAL LOW (ref 8.9–10.3)
Chloride: 107 mmol/L (ref 98–111)
Creatinine, Ser: 0.72 mg/dL (ref 0.61–1.24)
GFR, Estimated: >60 mL/min (ref >60)
Glucose, Bld: 84 mg/dL (ref 70–99)
Potassium: 4.3 mmol/L (ref 3.5–5.1)
Sodium: 134 mmol/L — ABNORMAL LOW (ref 135–145)
Total Bilirubin: 0.3 mg/dL (ref 0.3–1.2)
Total Protein: 7 g/dL (ref 6.5–8.1)

## 2020-09-02 LAB — URINALYSIS, ROUTINE W REFLEX MICROSCOPIC
Bilirubin Urine: NEGATIVE
Glucose, UA: NEGATIVE mg/dL
Hgb urine dipstick: NEGATIVE
Ketones, ur: NEGATIVE mg/dL
Leukocytes,Ua: NEGATIVE
Nitrite: NEGATIVE
Protein, ur: NEGATIVE mg/dL
Specific Gravity, Urine: 1.026 (ref 1.005–1.030)
pH: 6 (ref 5.0–8.0)

## 2020-09-02 MED ORDER — IOHEXOL 300 MG/ML  SOLN
80.0000 mL | Freq: Once | INTRAMUSCULAR | Status: AC | PRN
  Administered 2020-09-02: 80 mL via INTRAVENOUS

## 2020-09-02 MED ORDER — SODIUM CHLORIDE 0.9 % IV BOLUS: 1000.0000 mL | Freq: Once | INTRAVENOUS | Status: DC

## 2020-09-02 NOTE — Discharge Instructions (Addendum)
You have a small hernia around your left groin which contains only fat.  This is benign and should not be causing any significant life-threatening injuries.  It is important that you call to establish care with a primary care physician.

## 2020-09-02 NOTE — ED Provider Notes (Signed)
[facility] EMERGENCY DEPT Provider Note   CSN: [id number] Arrival date & time: 09/02/20  1237     History Chief Complaint  Patient presents with  . Groin Swelling    Zachary Osborn is a 45 y.o. male.  He is here with a soft tissue mass in his upper inner left thigh that is been there for 6 months but over the past few weeks has been growing in size and becoming a little more uncomfortable.  No overlying skin changes.  No trauma.  No dysuria or genital lesions.  The history is provided by the patient.  Groin Pain This is a chronic problem. Episode onset: 6 months. The problem occurs constantly. The problem has been gradually worsening. Pertinent negatives include no chest pain, no abdominal pain, no headaches and no shortness of breath. Exacerbated by: palpation. Nothing relieves the symptoms. He has tried nothing for the symptoms. The treatment provided no relief.       Past Medical History:  Diagnosis Date  . Anxiety   . GERD (gastroesophageal reflux disease)   . History of hypotension   . Hx of migraines     Patient Active Problem List   Diagnosis Date Noted  . Polysubstance abuse ([place]) 01/23/2017  . Generalized anxiety disorder 11/04/2012    Past Surgical History:  Procedure Laterality Date  . BACK SURGERY  12/2016, 03/2017   Ruptured Disc       Family History  Problem Relation Age of Onset  . Obesity Mother     Social History   Tobacco Use  . Smoking status: Current Every Day Smoker    Packs/day: 1.00    Years: 20.00    Pack years: 20.00    Types: Cigarettes  . Smokeless tobacco: Never Used  Vaping Use  . Vaping Use: Never used  Substance Use Topics  . Alcohol use: Yes    Comment: Rare  . Drug use: Not Currently    Comment: prior use of cocaine and THC verified by UDS in 12/2016    Home Medications Prior to Admission medications   Not on File    Allergies    Wellbutrin [bupropion]  Review of Systems   Review of Systems   Constitutional: Positive for fatigue. Negative for fever.  HENT: Negative for sore throat.   Eyes: Negative for pain.  Respiratory: Negative for shortness of breath.   Cardiovascular: Negative for chest pain.  Gastrointestinal: Negative for abdominal pain.  Genitourinary: Negative for dysuria.  Musculoskeletal: Negative for gait problem.  Skin: Negative for rash.  Neurological: Negative for headaches.    Physical Exam Updated Vital Signs BP 120/68 (BP Location: Right Arm)   Pulse 71   Temp 98.6 F (37 C) (Oral)   Resp 16   Ht 5\' 11"  (1.803 m)   Wt 88.5 kg   SpO2 99%   BMI 27.20 kg/m   Physical Exam Vitals and nursing note reviewed.  Constitutional:      Appearance: Normal appearance. He is well-developed.  HENT:     Head: Normocephalic and atraumatic.  Eyes:     Conjunctiva/sclera: Conjunctivae normal.  Cardiovascular:     Rate and Rhythm: Normal rate and regular rhythm.     Heart sounds: No murmur heard.   Pulmonary:     Effort: Pulmonary effort is normal. No respiratory distress.     Breath sounds: Normal breath sounds.  Abdominal:     Palpations: Abdomen is soft.     Tenderness: There is no abdominal tenderness.  Hernia: There is no hernia in the left inguinal area, right femoral area, left femoral area or right inguinal area.  Genitourinary:    Penis: Normal.      Testes:        Right: Mass not present.        Left: Mass not present.  Musculoskeletal:        General: Tenderness present. Normal range of motion.     Cervical back: Neck supple.     Comments: In his inner upper left thigh there is approximately 2 cm mass mobile slightly tender with no overlying skin findings.  Not fluctuant.  Skin:    General: Skin is warm and dry.  Neurological:     General: No focal deficit present.     Mental Status: He is alert.     ED Results / Procedures / Treatments   Labs (all labs ordered are listed, but only abnormal results are displayed) Labs Reviewed   CBC WITH DIFFERENTIAL/PLATELET - Abnormal; Notable for the following components:      Result Value   Lymphs Abs 4.7 (*)    All other components within normal limits  COMPREHENSIVE METABOLIC PANEL - Abnormal; Notable for the following components:   Sodium 134 (*)    CO2 17 (*)    Calcium 8.4 (*)    AST 13 (*)    All other components within normal limits  URINALYSIS, ROUTINE W REFLEX MICROSCOPIC    EKG None  Radiology CT PELVIS W CONTRAST  Result Date: 09/02/2020 CLINICAL DATA:  Enlarging mass in left groin for approximately 3 weeks. Mass seen on recent ultrasound. EXAM: CT PELVIS WITH CONTRAST TECHNIQUE: Multidetector CT imaging of the pelvis was performed using the standard protocol following the bolus administration of intravenous contrast. CONTRAST:  44mL OMNIPAQUE IOHEXOL 300 MG/ML  SOLN COMPARISON:  Ultrasound on 09/02/2020 FINDINGS: Lower Urinary Tract: Unremarkable urinary bladder. Bowel: Sigmoid diverticulosis is seen, without evidence of diverticulitis. Vascular/Lymphatic: No pathologically enlarged lymph nodes or other significant abnormality. Reproductive:  No mass or other significant abnormality. Other: No soft tissue mass identified in the left groin or inguinal region, however there is a very small amount of fat extending into the proximal left inguinal canal which likely accounts for the abnormality seen on recent ultrasound. Musculoskeletal: No suspicious bone lesions identified. IMPRESSION: Very small left inguinal hernia containing only fat, which likely accounts for the abnormality seen on recent ultrasound. No soft tissue mass or lymphadenopathy identified. Colonic diverticulosis. No radiographic evidence of diverticulitis. Electronically Signed   By: Marlaine Hind M.D.   On: 09/02/2020 16:54   DG Chest Port 1 View  Result Date: 09/02/2020 CLINICAL DATA:  Weakness. EXAM: PORTABLE CHEST 1 VIEW COMPARISON:  None. FINDINGS: Borderline enlargement of the cardiac silhouette,  which is accentuated by AP technique. Both lungs are clear. No visible pleural effusions or pneumothorax. No acute osseous abnormality. IMPRESSION: No active disease. Electronically Signed   By: Margaretha Sheffield MD   On: 09/02/2020 14:00   [place] LT LOWER EXTREM LTD SOFT TISSUE NON VASCULAR  Result Date: 09/02/2020 CLINICAL DATA:  Left groin mass. EXAM: LEFT LOWER EXTREMITY SOFT TISSUE ULTRASOUND LIMITED TECHNIQUE: Ultrasound examination was performed including evaluation of the muscles, tendons, joint, and adjacent soft tissues. COMPARISON:  None. FINDINGS: Focused ultrasound exam was performed in the region of patient concern. 2.6 x 1.2 x 2.3 cm complex lesion is identified in the superficial soft tissues. Lesion does not have imaging characteristics typical for cyst or lymph node.  IMPRESSION: Left groin abnormality of concern does not represent a simple cyst or have characteristic sonographic features of lymph node. As this is clinically apparent, follow-up could be used to ensure size stability. If further imaging is warranted, consider CT or MRI. Electronically Signed   By: Misty Stanley M.D.   On: 09/02/2020 14:14    Procedures Procedures   Medications Ordered in ED Medications  sodium chloride 0.9 % bolus 1,000 mL (has no administration in time range)  iohexol (OMNIPAQUE) 300 MG/ML solution 80 mL (80 mLs Intravenous Contrast Given 09/02/20 1620)    ED Course  I have reviewed the triage vital signs and the nursing notes.  Pertinent labs & imaging results that were available during my care of the patient were reviewed by me and considered in my medical decision making (see chart for details).  Clinical Course as of 09/02/20 1706  Thu Sep 02, 2020  1505 Patient's ultrasound did not give clear answer what that soft tissue mass was.  They recommend MRI or CT.  I have ordered CT pelvis with contrast. Discussed with CT tech regarding imaging. [MB]  3790 I  [MT]    Clinical Course User  Index [MB] Hayden Rasmussen, MD [MT] Wyvonnia Dusky, MD   MDM Rules/Calculators/A&P                         This patient complains of left groin mass; this involves an extensive number of treatment Options and is a complaint that carries with it a high risk of complications and Morbidity. The differential includes hernia, lymphadenopathy, lipoma, less likely sarcoma or myosarcoma, abscess  I ordered, reviewed and interpreted labs, which included CBC with normal white count normal hemoglobin, chemistries fairly normal other than low bicarb and low calcium question some dehydration, I ordered medication IV fluids I ordered imaging studies which included chest x-ray and soft tissue left thigh and I independently    visualized and interpreted imaging which showed complex 2 x 2 centimeter lesion in the soft tissues that does not appear to be a lymph node Previous records obtained and reviewed in epic, no recent admissions  After the interventions stated above, I reevaluated the patient and found essentially asymptomatic.  His care is signed out to oncoming provider Dr. Langston Masker to follow-up on results of CT pelvis with contrast.   Final Clinical Impression(s) / ED Diagnoses Final diagnoses:  Groin mass  Mass of left thigh    Rx / DC Orders ED Discharge Orders    None       Hayden Rasmussen, MD 09/02/20 1708

## 2020-09-02 NOTE — ED Notes (Signed)
Patient transported to CT 

## 2020-09-02 NOTE — ED Triage Notes (Signed)
Lump to the left groin has been there for awhile but it has been growing bigger and uncomfortable.

## 2021-05-17 ENCOUNTER — Emergency Department (HOSPITAL_BASED_OUTPATIENT_CLINIC_OR_DEPARTMENT_OTHER)
Admission: EM | Admit: 2021-05-17 | Discharge: 2021-05-17 | Disposition: A | Discharge status: Home / Self Care | Payer: Managed Care, Other (non HMO) | Attending: Emergency Medicine | Admitting: Emergency Medicine

## 2021-05-17 ENCOUNTER — Encounter (HOSPITAL_BASED_OUTPATIENT_CLINIC_OR_DEPARTMENT_OTHER): Payer: Self-pay

## 2021-05-17 ENCOUNTER — Other Ambulatory Visit: Payer: Self-pay

## 2021-05-17 DIAGNOSIS — K625 Hemorrhage of anus and rectum: Secondary | ICD-10-CM | POA: Diagnosis present

## 2021-05-17 DIAGNOSIS — R42 Dizziness and giddiness: Secondary | ICD-10-CM | POA: Diagnosis not present

## 2021-05-17 DIAGNOSIS — R2242 Localized swelling, mass and lump, left lower limb: Secondary | ICD-10-CM | POA: Insufficient documentation

## 2021-05-17 DIAGNOSIS — K648 Other hemorrhoids: Secondary | ICD-10-CM | POA: Diagnosis not present

## 2021-05-17 DIAGNOSIS — K922 Gastrointestinal hemorrhage, unspecified: Secondary | ICD-10-CM | POA: Insufficient documentation

## 2021-05-17 DIAGNOSIS — R1909 Other intra-abdominal and pelvic swelling, mass and lump: Secondary | ICD-10-CM

## 2021-05-17 LAB — CBC
HCT: 47.2 % (ref 39.0–52.0)
Hemoglobin: 15.6 g/dL (ref 13.0–17.0)
MCH: 29.8 pg (ref 26.0–34.0)
MCHC: 33.1 g/dL (ref 30.0–36.0)
MCV: 90.2 fL (ref 80.0–100.0)
Platelets: 260 10*3/uL (ref 150–400)
RBC: 5.23 MIL/uL (ref 4.22–5.81)
RDW: 12.7 % (ref 11.5–15.5)
WBC: 9 10*3/uL (ref 4.0–10.5)
nRBC: 0 % (ref 0.0–0.2)

## 2021-05-17 LAB — COMPREHENSIVE METABOLIC PANEL
ALT: 7 U/L (ref 0–44)
AST: 17 U/L (ref 15–41)
Albumin: 4 g/dL (ref 3.5–5.0)
Alkaline Phosphatase: 70 U/L (ref 38–126)
Anion gap: 11 (ref 5–15)
BUN: 13 mg/dL (ref 6–20)
CO2: 20 mmol/L — ABNORMAL LOW (ref 22–32)
Calcium: 8.4 mg/dL — ABNORMAL LOW (ref 8.9–10.3)
Chloride: 105 mmol/L (ref 98–111)
Creatinine, Ser: 0.92 mg/dL (ref 0.61–1.24)
GFR, Estimated: >60 mL/min (ref >60)
Glucose, Bld: 148 mg/dL — ABNORMAL HIGH (ref 70–99)
Potassium: 3.9 mmol/L (ref 3.5–5.1)
Sodium: 136 mmol/L (ref 135–145)
Total Bilirubin: 0.3 mg/dL (ref 0.3–1.2)
Total Protein: 6.9 g/dL (ref 6.5–8.1)

## 2021-05-17 LAB — OCCULT BLOOD X 1 CARD TO LAB, STOOL: Fecal Occult Bld: POSITIVE — AB

## 2021-05-17 NOTE — ED Triage Notes (Signed)
States a little diarrhea this AM but had a larger stool ~1.5 hours ago and states it was bloody. No tarry stools. States he has a skin tag and hemorrhoid in that area. Lightheadedness since episode.

## 2021-05-17 NOTE — ED Provider Notes (Signed)
[facility] EMERGENCY DEPT Provider Note   CSN: [id number] Arrival date & time: 05/17/21  1321     History  Chief Complaint  Patient presents with   Rectal Bleeding    Geroge Gilliam is a 46 y.o. male.   Rectal Bleeding  46 year old male with a history of diverticulosis, anxiety, GERD presenting to the emergency department with a chief complaint of painless rectal bleeding.  The patient states that he had sudden onset urgency and tenesmus earlier today and then had a large bloody bowel movement of bright red blood.  He denies any recent history of dark tarry stools.  He does have a history of hemorrhoids.  He has felt somewhat lightheaded since this episode.  He denies any further episodes of rectal bleeding.  The patient additionally wanted a left groin mass to be evaluated that has been present since this past May.  He was previously ultrasounded outpatient.  It has not grown in size.  It is not painful.  Home Medications Prior to Admission medications   Not on File      Allergies    Wellbutrin [bupropion]    Review of Systems   Review of Systems  Gastrointestinal:  Positive for hematochezia.  All other systems reviewed and are negative.  Physical Exam Updated Vital Signs BP 114/76 (BP Location: Right Arm)    Pulse 64    Temp 97.7 F (36.5 C) (Oral)    Resp 17    Ht 5\' 11"  (1.803 m)    Wt 86.2 kg    SpO2 100%    BMI 26.50 kg/m  Physical Exam Vitals and nursing note reviewed. Exam conducted with a chaperone present.  Constitutional:      General: He is not in acute distress.    Appearance: He is well-developed.  HENT:     Head: Normocephalic and atraumatic.  Eyes:     Conjunctiva/sclera: Conjunctivae normal.  Cardiovascular:     Rate and Rhythm: Normal rate and regular rhythm.     Heart sounds: No murmur heard. Pulmonary:     Effort: Pulmonary effort is normal. No respiratory distress.     Breath sounds: Normal breath sounds.  Abdominal:      Palpations: Abdomen is soft.     Tenderness: There is no abdominal tenderness.  Genitourinary:    Comments: Dried red blood noted per rectum, fecal occult positive.  Internal hemorrhoids noted Musculoskeletal:        General: No swelling.     Cervical back: Neck supple.     Comments: Left groin firm, mobile, nontender mass present, no surrounding erythema  Skin:    General: Skin is warm and dry.     Capillary Refill: Capillary refill takes less than 2 seconds.  Neurological:     Mental Status: He is alert.  Psychiatric:        Mood and Affect: Mood normal.    ED Results / Procedures / Treatments   Labs (all labs ordered are listed, but only abnormal results are displayed) Labs Reviewed  COMPREHENSIVE METABOLIC PANEL - Abnormal; Notable for the following components:      Result Value   CO2 20 (*)    Glucose, Bld 148 (*)    Calcium 8.4 (*)    All other components within normal limits  OCCULT BLOOD X 1 CARD TO LAB, STOOL - Abnormal; Notable for the following components:   Fecal Occult Bld POSITIVE (*)    All other components within normal limits  CBC  EKG None  Radiology No results found.  Procedures Procedures    Medications Ordered in ED Medications - No data to display  ED Course/ Medical Decision Making/ A&P                           Medical Decision Making Amount and/or Complexity of Data Reviewed Labs: ordered.   45 year old male with a history of diverticulosis, anxiety, GERD presenting to the emergency department with a chief complaint of painless rectal bleeding.  The patient states that he had sudden onset urgency and tenesmus earlier today and then had a large bloody bowel movement of bright red blood.  He denies any recent history of dark tarry stools.  He does have a history of hemorrhoids.  He has felt somewhat lightheaded since this episode.  He denies any further episodes of rectal bleeding.  On arrival, the patient was afebrile, hemodynamically  stable, saturating well on room air.  Normal sinus rhythm noted on cardiac telemetry.  Physical exam significant for an abdominal exam that was benign with a rectal exam significant for an internal hemorrhoid and dried bright red blood noted that was fecal occult positive.  Differential diagnosis includes diverticular bleed versus internal hemorrhoid bleed.  Patient is currently hemodynamically stable with no further episodes of bleeding.  He is tolerating oral intake and well-appearing.  His CBC was significant for a hemoglobin of 15.6 and his CMP was generally unremarkable.   On chart review, the patient did have a CT pelvis with contrast in May 2022 which revealed sigmoid diverticulosis, very small left inguinal hernia present.  He had complained at that time of a left groin mobile and painless mass which was ultrasounded.  The ultrasound revealed a left groin mass that did not represent a simple cyst or have characteristic sonographic features of a lymph node.  Follow-up with CT or MRI was recommended.  The patient is considered low risk by the [facility] bleeding scale.  A referral was placed for outpatient follow-up with gastroenterology for routine clinic evaluation and follow-up.  On exam today, the patient persistently has a left groin mobile mass that is firm, nontender to palpation.  It does not appear to have changed in size per report from the patient.  He appears to have been stable in size over the past half of the year.  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  He has no testicular pain or masses.  I do not see any indication for emergent imaging at this time but advised that the patient follow-up with his providers outpatient for continued surveillance.  DC Instructions: Your exam/testing today was overall reassuring.  Your vital signs are reassuring.  Your lower GI bleeding appears to have stopped.  Your hemoglobin is also normal.  Your GI bleed can be characterized as "low risk" which does not  require any acute medical intervention at this time.  The potential etiology of your bleeding today could be due to a diverticular bleed as diverticulosis was seen on a CT scan as recent as half a year ago.  Additionally, you do have internal hemorrhoids on exam which could be a cause of painless GI bleeding.  Recommend routine follow-up with gastroenterology in clinic.  Also recommend that you establish care with a PCP to discuss potential biopsy of your left groin mass.  It appears to be stable compared to previous measurements.  If you develop worsening pain in that vicinity and a change in size of  the mass, return to the emergency department for repeat imaging.   Final Clinical Impression(s) / ED Diagnoses Final diagnoses:  Acute lower GI bleeding  Left groin mass    Rx / DC Orders ED Discharge Orders     None         Regan Lemming, MD 05/19/21 1550

## 2021-05-17 NOTE — Discharge Instructions (Addendum)
You were evaluated in the Emergency Department and after careful evaluation, we did not find any emergent condition requiring admission or further testing in the hospital.  Your exam/testing today was overall reassuring.  Your vital signs are reassuring.  Your lower GI bleeding appears to have stopped.  Your hemoglobin is also normal.  Your GI bleed can be characterized as "low risk" which does not require any acute medical intervention at this time.  The potential etiology of your bleeding today could be due to a diverticular bleed as diverticulosis was seen on a CT scan as recent as half a year ago.  Additionally, you do have internal hemorrhoids on exam which could be a cause of painless GI bleeding.  Recommend routine follow-up with gastroenterology in clinic.  Also recommend that you establish care with a PCP to discuss potential biopsy of your left groin mass.  It appears to be stable compared to previous measurements.  If you develop worsening pain in that vicinity and a change in size of the mass, return to the emergency department for repeat imaging.  Please return to the Emergency Department if you experience any worsening of your condition.  Thank you for allowing [place] to be a part of your care.

## 2021-06-07 ENCOUNTER — Encounter: Payer: Self-pay | Admitting: Physician Assistant

## 2021-06-07 ENCOUNTER — Ambulatory Visit: Payer: Managed Care, Other (non HMO) | Admitting: Physician Assistant

## 2021-06-07 VITALS — BP 130/76 | HR 105 | Ht 71.0 in | Wt 208.6 lb

## 2021-06-07 DIAGNOSIS — K625 Hemorrhage of anus and rectum: Secondary | ICD-10-CM

## 2021-06-07 MED ORDER — NA SULFATE-K SULFATE-MG SULF 17.5-3.13-1.6 GM/177ML PO SOLN: 1.0000 | ORAL | 0 refills | Status: DC

## 2021-06-07 NOTE — Progress Notes (Signed)
Chief Complaint: ED follow-up GI bleed  HPI:    [name] is a 46 year old male with a past medical history of reflux and anxiety, who presents to clinic today as a referral from the ER Dr. Regan Lemming after being seen for GI bleeding.    05/17/2021 patient seen in clinic for painless rectal bleeding.  At that time describes sudden onset urgency and tenesmus earlier that day and then a large bloody bowel movement with bright red blood.  Described history of hemorrhoids.  Described feeling lightheaded since the episode.  Rectal exam showed dried red blood around the rectum, fecal occult positive and internal hemorrhoids.  Labs including CMP and CBC were normal.  At that time discussed a CT of the pelvis with contrast in May which revealed sigmoid diverticulosis and a very small left inguinal hernia.  It was recommended he follow-up with [place].    Today, the patient tells me that he is used to a little bit of bright red blood on the toilet paper when he wipes but has always thought this was from hemorrhoids.  Most recently he had a large episode of bleeding which felt "more internal", this only occurred once and scared him enough to go to the ED.  He had no further bleeding after that.  No other GI complaints or concerns.    Denies fever, chills, weight loss, change in bowel habits, heartburn, reflux or symptoms that awaken him from sleep.  Past Medical History:  Diagnosis Date   Anxiety    GERD (gastroesophageal reflux disease)    History of hypotension    Hx of migraines     Past Surgical History:  Procedure Laterality Date   BACK SURGERY  12/2016, 03/2017   Ruptured Disc    No current outpatient medications on file.   No current facility-administered medications for this visit.    Allergies as of 06/07/2021 - Review Complete 05/17/2021  Allergen Reaction Noted   Wellbutrin [bupropion] Other (See Comments) 11/21/2018    Family History  Problem Relation Age of Onset   Obesity  Mother     Social History   Socioeconomic History   Marital status: Significant Other    Spouse name: Not on file   Number of children: Not on file   Years of education: Not on file   Highest education level: Not on file  Occupational History   Occupation: Engineering  Tobacco Use   Smoking status: Every Day    Packs/day: 1.00    Years: 20.00    Pack years: 20.00    Types: Cigarettes   Smokeless tobacco: Never  Vaping Use   Vaping Use: Never used  Substance and Sexual Activity   Alcohol use: Yes    Comment: Rare   Drug use: Not Currently    Comment: prior use of cocaine and THC verified by UDS in 12/2016   Sexual activity: Yes    Partners: Female  Other Topics Concern   Not on file  Social History Narrative   Not on file   Social Determinants of Health   Financial Resource Strain: Not on file  Food Insecurity: Not on file  Transportation Needs: Not on file  Physical Activity: Not on file  Stress: Not on file  Social Connections: Not on file  Intimate Partner Violence: Not on file    Review of Systems:    Constitutional: No weight loss, fever or chills Skin: No rash Cardiovascular: No chest pain Respiratory: No SOB Gastrointestinal: See HPI  and otherwise negative Genitourinary: No dysuria Neurological: No headache, dizziness or syncope Musculoskeletal: No new muscle or joint pain Hematologic: No bruising Psychiatric: No history of depression or anxiety   Physical Exam:  Vital signs: BP 130/76    Pulse (!) 105    Ht 5\' 11"  (1.803 m)    Wt 208 lb 9.6 oz (94.6 kg)    SpO2 96%    BMI 29.09 kg/m    Constitutional:   Pleasant Caucasian male appears to be in NAD, Well developed, Well nourished, alert and cooperative Head:  Normocephalic and atraumatic. Eyes:   PEERL, EOMI. No icterus. Conjunctiva pink. Ears:  Normal auditory acuity. Neck:  Supple Throat: Oral cavity and pharynx without inflammation, swelling or lesion.  Respiratory: Respirations even and  unlabored. Lungs clear to auscultation bilaterally.   No wheezes, crackles, or rhonchi.  Cardiovascular: Normal S1, S2. No MRG. Regular rate and rhythm. No peripheral edema, cyanosis or pallor.  Gastrointestinal:  Soft, nondistended, mild epigastric ttp. No rebound or guarding. Normal bowel sounds. No appreciable masses or hepatomegaly. Rectal:  Not performed.  Msk:  Symmetrical without gross deformities. Without edema, no deformity or joint abnormality.  Neurologic:  Alert and  oriented x4;  grossly normal neurologically.  Skin:   Dry and intact without significant lesions or rashes. Psychiatric: Oriented to person, place and time. Demonstrates good judgement and reason without abnormal affect or behaviors.  RELEVANT LABS AND IMAGING: CBC    Component Value Date/Time   WBC 9.0 05/17/2021 1336   RBC 5.23 05/17/2021 1336   HGB 15.6 05/17/2021 1336   HCT 47.2 05/17/2021 1336   PLT 260 05/17/2021 1336   MCV 90.2 05/17/2021 1336   MCV 93.4 05/05/2014 1416   MCH 29.8 05/17/2021 1336   MCHC 33.1 05/17/2021 1336   RDW 12.7 05/17/2021 1336   LYMPHSABS 4.7 (H) 09/02/2020 1423   MONOABS 0.5 09/02/2020 1423   EOSABS 0.0 09/02/2020 1423   BASOSABS 0.1 09/02/2020 1423    CMP     Component Value Date/Time   NA 136 05/17/2021 1336   K 3.9 05/17/2021 1336   CL 105 05/17/2021 1336   CO2 20 (L) 05/17/2021 1336   GLUCOSE 148 (H) 05/17/2021 1336   BUN 13 05/17/2021 1336   CREATININE 0.92 05/17/2021 1336   CALCIUM 8.4 (L) 05/17/2021 1336   PROT 6.9 05/17/2021 1336   ALBUMIN 4.0 05/17/2021 1336   AST 17 05/17/2021 1336   ALT 7 05/17/2021 1336   ALKPHOS 70 05/17/2021 1336   BILITOT 0.3 05/17/2021 1336   GFRNONAA >60 05/17/2021 1336   GFRAA >60 01/23/2017 1551    Assessment: 1.  Rectal bleeding: Long history of bright red blood on the toilet paper when wiping, most recent episode with larger amount x1, labs normal, none since; consider hemorrhoids versus diverticular versus  other  Plan: 1.  Scheduled patient for diagnostic colonoscopy in the [place] with Dr. Rush Landmark.  Did provide the patient a detailed list of risks for the procedure and he agrees to proceed. Patient is appropriate for endoscopic procedure(s) in the ambulatory ([facility]) setting.  2.  Patient to follow in clinic per recommendations after time of procedure.  Ellouise Newer, PA-C [facility] 06/07/2021, 2:30 PM

## 2021-06-07 NOTE — Patient Instructions (Signed)
PROCEDURES: You have been scheduled for a colonoscopy. Please follow the written instructions given to you at your visit today. Please pick up your prep supplies at the pharmacy within the next 1-3 days. If you use inhalers (even only as needed), please bring them with you on the day of your procedure.    BMI:  If you are age 47 or older, your body mass index should be between 23-30. Your Body mass index is 29.09 kg/m. If this is out of the aforementioned range listed, please consider follow up with your Primary Care Provider.  If you are age 55 or younger, your body mass index should be between 19-25. Your Body mass index is 29.09 kg/m. If this is out of the aformentioned range listed, please consider follow up with your Primary Care Provider.   MY CHART:  The [facility] providers would like to encourage you to use [facility] to communicate with providers for non-urgent requests or questions.  Due to long hold times on the telephone, sending your provider a message by [facility] may be a faster and more efficient way to get a response.  Please allow 48 business hours for a response.  Please remember that this is for non-urgent requests.   Thank you for trusting me with your gastrointestinal care!    Ellouise Newer, [place]

## 2021-06-08 NOTE — Progress Notes (Signed)
Attending Physician's Attestation   I have reviewed the chart.   I agree with the Advanced Practitioner's note, impression, and recommendations with any updates as below.    Tyee Vandevoorde Mansouraty, MD [facility] Advanced Endoscopy Office # [phone]  

## 2021-06-09 ENCOUNTER — Encounter: Payer: Self-pay | Admitting: Gastroenterology

## 2021-06-14 ENCOUNTER — Ambulatory Visit (AMBULATORY_SURGERY_CENTER): Payer: Managed Care, Other (non HMO) | Admitting: Gastroenterology

## 2021-06-14 ENCOUNTER — Encounter: Payer: Self-pay | Admitting: Gastroenterology

## 2021-06-14 VITALS — BP 86/50 | HR 79 | Temp 98.0°F | Resp 14 | Ht 71.0 in | Wt 208.0 lb

## 2021-06-14 DIAGNOSIS — D120 Benign neoplasm of cecum: Secondary | ICD-10-CM

## 2021-06-14 DIAGNOSIS — D123 Benign neoplasm of transverse colon: Secondary | ICD-10-CM | POA: Diagnosis not present

## 2021-06-14 DIAGNOSIS — Z1211 Encounter for screening for malignant neoplasm of colon: Secondary | ICD-10-CM | POA: Diagnosis not present

## 2021-06-14 DIAGNOSIS — D129 Benign neoplasm of anus and anal canal: Secondary | ICD-10-CM

## 2021-06-14 DIAGNOSIS — D128 Benign neoplasm of rectum: Secondary | ICD-10-CM | POA: Diagnosis not present

## 2021-06-14 DIAGNOSIS — D125 Benign neoplasm of sigmoid colon: Secondary | ICD-10-CM | POA: Diagnosis not present

## 2021-06-14 DIAGNOSIS — K625 Hemorrhage of anus and rectum: Secondary | ICD-10-CM

## 2021-06-14 MED ORDER — SODIUM CHLORIDE 0.9 % IV SOLN: 500.0000 mL | Freq: Once | INTRAVENOUS | Status: DC

## 2021-06-14 NOTE — Progress Notes (Signed)
Pt's states no medical or surgical changes since previsit or office visit.  VS CW  

## 2021-06-14 NOTE — Progress Notes (Signed)
Called to room to assist during endoscopic procedure.  Patient ID and intended procedure confirmed with present staff. Received instructions for my participation in the procedure from the performing physician.  

## 2021-06-14 NOTE — Op Note (Signed)
[facility] Patient Name: Zachary Osborn Procedure Date: 06/14/2021 2:52 PM MRN: [id number] Endoscopist: Justice Britain , MD Age: 46 Referring MD:  Date of Birth: 07-Mar-1976 Gender: Male Account #: [id number] Procedure:                Colonoscopy Indications:              Screening for colorectal malignant neoplasm Medicines:                Monitored Anesthesia Care Procedure:                Pre-Anesthesia Assessment:                           - Prior to the procedure, a History and Physical                            was performed, and patient medications and                            allergies were reviewed. The patient's tolerance of                            previous anesthesia was also reviewed. The risks                            and benefits of the procedure and the sedation                            options and risks were discussed with the patient.                            All questions were answered, and informed consent                            was obtained. Prior Anticoagulants: The patient has                            taken no previous anticoagulant or antiplatelet                            agents. ASA Grade Assessment: II - A patient with                            mild systemic disease. After reviewing the risks                            and benefits, the patient was deemed in                            satisfactory condition to undergo the procedure.                           After obtaining informed consent, the colonoscope  was passed under direct vision. Throughout the                            procedure, the patient's blood pressure, pulse, and                            oxygen saturations were monitored continuously. The                            Olympus CF-HQ190L [id number]) Colonoscope was                            introduced through the anus and advanced to the 5                            cm into the ileum.  The colonoscopy was somewhat                            difficult due to restricted mobility of the colon.                            Successful completion of the procedure was aided by                            changing the patient's position, using manual                            pressure, withdrawing and reinserting the scope,                            straightening and shortening the scope to obtain                            bowel loop reduction and using scope torsion. The                            patient tolerated the procedure. Scope In: 3:09:44 PM Scope Out: 3:31:37 PM Scope Withdrawal Time: 0 hours 16 minutes 49 seconds  Total Procedure Duration: 0 hours 21 minutes 53 seconds  Findings:                 The digital rectal exam findings include                            hemorrhoids. Pertinent negatives include no                            palpable rectal lesions.                           The left colon was moderately tortuous.                           The terminal ileum and ileocecal valve appeared  normal.                           Five sessile polyps were found in the rectum (2),                            sigmoid colon (1), hepatic flexure (1) and cecum                            (1). The polyps were 4 to 10 mm in size. These                            polyps were removed with a cold snare. Resection                            and retrieval were complete.                           Multiple medium-mouthed diverticula were found in                            the recto-sigmoid colon and sigmoid colon.                           Non-bleeding non-thrombosed external and internal                            hemorrhoids were found during retroflexion, during                            perianal exam and during digital exam. The                            hemorrhoids were Grade III (internal hemorrhoids                            that prolapse but  require manual reduction). Complications:            No immediate complications. Estimated Blood Loss:     Estimated blood loss was minimal. Impression:               - Hemorrhoids found on digital rectal exam.                           - Tortuous left colon.                           - The examined portion of the ileum was normal.                           - Five 4 to 10 mm polyps in the rectum, in the                            sigmoid colon, at the hepatic flexure and in the  cecum, removed with a cold snare. Resected and                            retrieved.                           - Diverticulosis in the recto-sigmoid colon and in                            the sigmoid colon.                           - Non-bleeding non-thrombosed external and internal                            hemorrhoids. Recommendation:           - The patient will be observed post-procedure,                            until all discharge criteria are met.                           - Discharge patient to home.                           - High fiber diet.                           - Use FiberCon 1-2 tablets PO daily.                           - Continue present medications.                           - Consider Anusol suppositories in future if needed                            (if significant bleeding then nightly x 1 week).                           - Await pathology results.                           - Repeat colonoscopy in 3 years for surveillance                            based on pathology results Consider pediatric                            colonoscope in future use due to significant                            tortuosity..                           - The findings and recommendations were discussed  with the patient. Justice Britain, MD 06/14/2021 3:51:21 PM

## 2021-06-14 NOTE — Progress Notes (Signed)
GASTROENTEROLOGY PROCEDURE H&P NOTE   Primary Care Physician: Pcp, No  HPI: Zachary Osborn is a 46 y.o. male who presents for Colonoscopy for screening.  Past Medical History:  Diagnosis Date   Anxiety    GERD (gastroesophageal reflux disease)    History of hypotension    Hx of migraines    Past Surgical History:  Procedure Laterality Date   BACK SURGERY  12/2016, 03/2017   Ruptured Disc   No current outpatient medications on file.   Current Facility-Administered Medications  Medication Dose Route Frequency Provider Last Rate Last Admin   0.9 %  sodium chloride infusion  500 mL Intravenous Once Mansouraty, Telford Nab., MD       No current outpatient medications on file.  Current Facility-Administered Medications:    0.9 %  sodium chloride infusion, 500 mL, Intravenous, Once, Mansouraty, Telford Nab., MD Allergies  Allergen Reactions   Wellbutrin [Bupropion] Other (See Comments)    Made suicidal   Family History  Problem Relation Age of Onset   Obesity Mother    Social History   Socioeconomic History   Marital status: Significant Other    Spouse name: Not on file   Number of children: Not on file   Years of education: Not on file   Highest education level: Not on file  Occupational History   Occupation: Engineering  Tobacco Use   Smoking status: Every Day    Packs/day: 1.00    Years: 20.00    Pack years: 20.00    Types: Cigarettes   Smokeless tobacco: Never  Vaping Use   Vaping Use: Never used  Substance and Sexual Activity   Alcohol use: Yes    Comment: Rare   Drug use: Not Currently    Comment: 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 GEN: NAD EYE: Sclerae anicteric ENT: MMM CV: Non-tachycardic GI: Soft, NT/ND NEURO:  Alert & Oriented x 3  Lab Results: No results for input(s): WBC, HGB, HCT, PLT in the last 72 hours. BMET No results for input(s): NA, K, CL, CO2, GLUCOSE, BUN, CREATININE, CALCIUM in the last 72 hours. LFT No results for input(s): PROT, ALBUMIN, AST, ALT, ALKPHOS, BILITOT, BILIDIR, IBILI in the last 72 hours. PT/INR No results for input(s): LABPROT, INR in the last 72 hours.   Impression / Plan: This is a 46 y.o.male who presents for Colonoscopy for screening.  The risks and benefits of endoscopic evaluation/treatment were discussed with the patient and/or family; these include but are not limited to the risk of perforation, infection, bleeding, missed lesions, lack of diagnosis, severe illness requiring hospitalization, as well as anesthesia and sedation related illnesses.  The patient's history has been reviewed, patient examined, no change in status, and  deemed stable for procedure.  The patient and/or family is agreeable to proceed.    Justice Britain, MD [facility] Advanced Endoscopy Office # [phone]

## 2021-06-14 NOTE — Progress Notes (Signed)
To pacu, VSS. Report to [email or web address] 

## 2021-06-14 NOTE — Patient Instructions (Signed)
Handouts given for high fiber diet, hemorrhoids, diverticulosis and polyps. Use a high fiber diet, use FiberCon 1-2 tablets orally daily.  Consider Anusol suppositories in future if needed ( if significant bleeding then nightly x1 week)   Await pathology results.    YOU HAD AN ENDOSCOPIC PROCEDURE TODAY AT [facility]:   Refer to the procedure report that was given to you for any specific questions about what was found during the examination.  If the procedure report does not answer your questions, please call your gastroenterologist to clarify.  If you requested that your care partner not be given the details of your procedure findings, then the procedure report has been included in a sealed envelope for you to review at your convenience later.  YOU SHOULD EXPECT: Some feelings of bloating in the abdomen. Passage of more gas than usual.  Walking can help get rid of the air that was put into your GI tract during the procedure and reduce the bloating. If you had a lower endoscopy (such as a colonoscopy or flexible sigmoidoscopy) you may notice spotting of blood in your stool or on the toilet paper. If you underwent a bowel prep for your procedure, you may not have a normal bowel movement for a few days.  Please Note:  You might notice some irritation and congestion in your nose or some drainage.  This is from the oxygen used during your procedure.  There is no need for concern and it should clear up in a day or so.  SYMPTOMS TO REPORT IMMEDIATELY:  Following lower endoscopy (colonoscopy or flexible sigmoidoscopy):  Excessive amounts of blood in the stool  Significant tenderness or worsening of abdominal pains  Swelling of the abdomen that is new, acute  Fever of 100F or higher   For urgent or emergent issues, a gastroenterologist can be reached at any hour by calling [phone]. Do not use MyChart messaging for urgent concerns.    DIET:  We do recommend a small meal at  first, but then you may proceed to your regular diet.  Drink plenty of fluids but you should avoid alcoholic beverages for 24 hours.  ACTIVITY:  You should plan to take it easy for the rest of today and you should NOT DRIVE or use heavy machinery until tomorrow (because of the sedation medicines used during the test).    FOLLOW UP: Our staff will call the number listed on your records 48-72 hours following your procedure to check on you and address any questions or concerns that you may have regarding the information given to you following your procedure. If we do not reach you, we will leave a message.  We will attempt to reach you two times.  During this call, we will ask if you have developed any symptoms of COVID 19. If you develop any symptoms (ie: fever, flu-like symptoms, shortness of breath, cough etc.) before then, please call [phone].  If you test positive for Covid 19 in the 2 weeks post procedure, please call and report this information to [place].    If any biopsies were taken you will be contacted by phone or by letter within the next 1-3 weeks.  Please call us at [phone] if you have not heard about the biopsies in 3 weeks.    SIGNATURES/CONFIDENTIALITY: You and/or your care partner have signed paperwork which will be entered into your electronic medical record.  These signatures attest to the fact that that the information above on  your After Visit Summary has been reviewed and is understood.  Full responsibility of the confidentiality of this discharge information lies with you and/or your care-partner.

## 2021-06-16 ENCOUNTER — Telehealth: Payer: Self-pay

## 2021-06-16 NOTE — Telephone Encounter (Signed)
°  Follow up Call-  Call back number 06/14/2021  Post procedure Call Back phone  # [phone]  Permission to leave phone message Yes  Some recent data might be hidden     Patient questions:  Do you have a fever, pain , or abdominal swelling? No. Pain Score  0 *  Have you tolerated food without any problems? Yes.    Have you been able to return to your normal activities? Yes.    Do you have any questions about your discharge instructions: Diet   No. Medications  No. Follow up visit  No.  Do you have questions or concerns about your Care? No.  Actions: * If pain score is 4 or above: No action needed, pain <4.

## 2021-06-20 ENCOUNTER — Encounter: Payer: Self-pay | Admitting: Gastroenterology

## 2022-02-22 ENCOUNTER — Other Ambulatory Visit: Payer: Self-pay
